# Patient Record
Sex: Female | Born: 1965
Health system: Southern US, Community
[De-identification: ages and names within clinical notes are randomized; demographics above are authoritative.]

## PROBLEM LIST (undated history)

## (undated) DIAGNOSIS — D219 Benign neoplasm of connective and other soft tissue, unspecified: Secondary | ICD-10-CM

## (undated) HISTORY — PX: TONSILLECTOMY: SUR1361

## (undated) HISTORY — PX: MYOMECTOMY ABDOMINAL APPROACH: SUR870

---

## 1998-12-25 ENCOUNTER — Inpatient Hospital Stay (HOSPITAL_COMMUNITY): Admission: AD | Admit: 1998-12-25 | Discharge: 1998-12-25 | Payer: Self-pay | Admitting: Obstetrics

## 1999-05-18 ENCOUNTER — Emergency Department (HOSPITAL_COMMUNITY): Admission: EM | Admit: 1999-05-18 | Discharge: 1999-05-18 | Payer: Self-pay | Admitting: Emergency Medicine

## 1999-06-24 ENCOUNTER — Emergency Department (HOSPITAL_COMMUNITY): Admission: EM | Admit: 1999-06-24 | Discharge: 1999-06-24 | Payer: Self-pay

## 1999-09-12 ENCOUNTER — Inpatient Hospital Stay (HOSPITAL_COMMUNITY): Admission: AD | Admit: 1999-09-12 | Discharge: 1999-09-12 | Payer: Self-pay | Admitting: Obstetrics

## 1999-12-03 ENCOUNTER — Inpatient Hospital Stay (HOSPITAL_COMMUNITY): Admission: AD | Admit: 1999-12-03 | Discharge: 1999-12-03 | Payer: Self-pay | Admitting: *Deleted

## 2000-08-11 ENCOUNTER — Inpatient Hospital Stay (HOSPITAL_COMMUNITY): Admission: AD | Admit: 2000-08-11 | Discharge: 2000-08-11 | Payer: Self-pay | Admitting: Obstetrics & Gynecology

## 2001-05-22 ENCOUNTER — Encounter: Admission: RE | Admit: 2001-05-22 | Discharge: 2001-05-22 | Payer: Self-pay | Admitting: Obstetrics & Gynecology

## 2001-05-29 ENCOUNTER — Ambulatory Visit (HOSPITAL_COMMUNITY): Admission: RE | Admit: 2001-05-29 | Discharge: 2001-05-29 | Payer: Self-pay | Admitting: Obstetrics

## 2001-06-12 ENCOUNTER — Emergency Department (HOSPITAL_COMMUNITY): Admission: EM | Admit: 2001-06-12 | Discharge: 2001-06-12 | Payer: Self-pay | Admitting: Emergency Medicine

## 2001-06-12 ENCOUNTER — Encounter: Payer: Self-pay | Admitting: Emergency Medicine

## 2002-07-10 ENCOUNTER — Ambulatory Visit (HOSPITAL_COMMUNITY): Admission: RE | Admit: 2002-07-10 | Discharge: 2002-07-10 | Payer: Self-pay | Admitting: *Deleted

## 2003-05-06 ENCOUNTER — Encounter: Admission: RE | Admit: 2003-05-06 | Discharge: 2003-05-06 | Payer: Self-pay | Admitting: Obstetrics and Gynecology

## 2003-05-12 ENCOUNTER — Ambulatory Visit (HOSPITAL_COMMUNITY): Admission: RE | Admit: 2003-05-12 | Discharge: 2003-05-12 | Payer: Self-pay | Admitting: Obstetrics and Gynecology

## 2003-06-10 ENCOUNTER — Encounter: Admission: RE | Admit: 2003-06-10 | Discharge: 2003-06-10 | Payer: Self-pay | Admitting: Obstetrics and Gynecology

## 2003-06-30 ENCOUNTER — Emergency Department (HOSPITAL_COMMUNITY): Admission: EM | Admit: 2003-06-30 | Discharge: 2003-06-30 | Payer: Self-pay | Admitting: Emergency Medicine

## 2003-07-15 ENCOUNTER — Ambulatory Visit (HOSPITAL_COMMUNITY): Admission: RE | Admit: 2003-07-15 | Discharge: 2003-07-15 | Payer: Self-pay | Admitting: Obstetrics and Gynecology

## 2003-12-16 ENCOUNTER — Encounter: Admission: RE | Admit: 2003-12-16 | Discharge: 2003-12-16 | Payer: Self-pay | Admitting: Obstetrics and Gynecology

## 2004-05-07 ENCOUNTER — Other Ambulatory Visit: Admission: RE | Admit: 2004-05-07 | Discharge: 2004-05-07 | Payer: Self-pay | Admitting: Obstetrics and Gynecology

## 2004-05-13 ENCOUNTER — Encounter: Admission: RE | Admit: 2004-05-13 | Discharge: 2004-05-13 | Payer: Self-pay | Admitting: Obstetrics and Gynecology

## 2004-06-04 ENCOUNTER — Ambulatory Visit (HOSPITAL_COMMUNITY): Admission: RE | Admit: 2004-06-04 | Discharge: 2004-06-04 | Payer: Self-pay | Admitting: Obstetrics and Gynecology

## 2004-07-07 ENCOUNTER — Encounter (INDEPENDENT_AMBULATORY_CARE_PROVIDER_SITE_OTHER): Payer: Self-pay | Admitting: *Deleted

## 2004-07-07 ENCOUNTER — Inpatient Hospital Stay (HOSPITAL_COMMUNITY): Admission: RE | Admit: 2004-07-07 | Discharge: 2004-07-09 | Payer: Self-pay | Admitting: Obstetrics and Gynecology

## 2004-08-03 ENCOUNTER — Ambulatory Visit (HOSPITAL_COMMUNITY): Admission: RE | Admit: 2004-08-03 | Discharge: 2004-08-03 | Payer: Self-pay | Admitting: Obstetrics and Gynecology

## 2005-03-16 ENCOUNTER — Encounter: Admission: RE | Admit: 2005-03-16 | Discharge: 2005-03-16 | Payer: Self-pay | Admitting: Obstetrics and Gynecology

## 2005-05-10 ENCOUNTER — Other Ambulatory Visit: Admission: RE | Admit: 2005-05-10 | Discharge: 2005-05-10 | Payer: Self-pay | Admitting: Obstetrics and Gynecology

## 2005-08-05 ENCOUNTER — Encounter: Admission: RE | Admit: 2005-08-05 | Discharge: 2005-08-05 | Payer: Self-pay | Admitting: Obstetrics and Gynecology

## 2005-12-14 ENCOUNTER — Emergency Department (HOSPITAL_COMMUNITY): Admission: EM | Admit: 2005-12-14 | Discharge: 2005-12-14 | Payer: Self-pay | Admitting: Emergency Medicine

## 2005-12-15 ENCOUNTER — Emergency Department (HOSPITAL_COMMUNITY): Admission: EM | Admit: 2005-12-15 | Discharge: 2005-12-15 | Payer: Self-pay | Admitting: Emergency Medicine

## 2006-03-17 ENCOUNTER — Encounter (INDEPENDENT_AMBULATORY_CARE_PROVIDER_SITE_OTHER): Payer: Self-pay | Admitting: Specialist

## 2006-03-17 ENCOUNTER — Ambulatory Visit (HOSPITAL_COMMUNITY): Admission: RE | Admit: 2006-03-17 | Discharge: 2006-03-17 | Payer: Self-pay | Admitting: Obstetrics and Gynecology

## 2006-05-26 ENCOUNTER — Other Ambulatory Visit: Admission: RE | Admit: 2006-05-26 | Discharge: 2006-05-26 | Payer: Self-pay | Admitting: Obstetrics and Gynecology

## 2006-06-02 ENCOUNTER — Encounter: Admission: RE | Admit: 2006-06-02 | Discharge: 2006-06-02 | Payer: Self-pay | Admitting: Obstetrics and Gynecology

## 2006-08-07 ENCOUNTER — Encounter: Admission: RE | Admit: 2006-08-07 | Discharge: 2006-08-07 | Payer: Self-pay | Admitting: Obstetrics and Gynecology

## 2007-04-17 ENCOUNTER — Emergency Department (HOSPITAL_COMMUNITY): Admission: EM | Admit: 2007-04-17 | Discharge: 2007-04-17 | Payer: Self-pay | Admitting: Emergency Medicine

## 2007-06-22 ENCOUNTER — Other Ambulatory Visit: Admission: RE | Admit: 2007-06-22 | Discharge: 2007-06-22 | Payer: Self-pay | Admitting: Obstetrics and Gynecology

## 2007-08-17 ENCOUNTER — Encounter: Admission: RE | Admit: 2007-08-17 | Discharge: 2007-08-17 | Payer: Self-pay | Admitting: Obstetrics and Gynecology

## 2008-05-17 ENCOUNTER — Inpatient Hospital Stay (HOSPITAL_COMMUNITY): Admission: AD | Admit: 2008-05-17 | Discharge: 2008-05-17 | Payer: Self-pay | Admitting: Obstetrics & Gynecology

## 2008-08-21 ENCOUNTER — Encounter: Admission: RE | Admit: 2008-08-21 | Discharge: 2008-08-21 | Payer: Self-pay | Admitting: Obstetrics and Gynecology

## 2008-11-30 ENCOUNTER — Emergency Department (HOSPITAL_COMMUNITY): Admission: EM | Admit: 2008-11-30 | Discharge: 2008-11-30 | Payer: Self-pay | Admitting: Emergency Medicine

## 2009-02-12 ENCOUNTER — Ambulatory Visit (HOSPITAL_COMMUNITY): Admission: RE | Admit: 2009-02-12 | Discharge: 2009-02-12 | Payer: Self-pay | Admitting: Gastroenterology

## 2009-02-12 ENCOUNTER — Encounter (INDEPENDENT_AMBULATORY_CARE_PROVIDER_SITE_OTHER): Payer: Self-pay | Admitting: *Deleted

## 2009-03-09 ENCOUNTER — Inpatient Hospital Stay (HOSPITAL_COMMUNITY): Admission: AD | Admit: 2009-03-09 | Discharge: 2009-03-09 | Payer: Self-pay | Admitting: Obstetrics & Gynecology

## 2009-03-15 ENCOUNTER — Emergency Department (HOSPITAL_COMMUNITY): Admission: EM | Admit: 2009-03-15 | Discharge: 2009-03-15 | Payer: Self-pay | Admitting: Emergency Medicine

## 2009-03-21 ENCOUNTER — Emergency Department (HOSPITAL_COMMUNITY): Admission: EM | Admit: 2009-03-21 | Discharge: 2009-03-21 | Payer: Self-pay | Admitting: Emergency Medicine

## 2009-04-01 ENCOUNTER — Encounter: Admission: RE | Admit: 2009-04-01 | Discharge: 2009-04-01 | Payer: Self-pay | Admitting: Family Medicine

## 2009-04-20 ENCOUNTER — Encounter: Admission: RE | Admit: 2009-04-20 | Discharge: 2009-04-20 | Payer: Self-pay | Admitting: Otolaryngology

## 2009-05-22 ENCOUNTER — Encounter (INDEPENDENT_AMBULATORY_CARE_PROVIDER_SITE_OTHER): Payer: Self-pay | Admitting: *Deleted

## 2009-06-17 ENCOUNTER — Telehealth: Payer: Self-pay | Admitting: Gastroenterology

## 2009-08-24 ENCOUNTER — Encounter: Admission: RE | Admit: 2009-08-24 | Discharge: 2009-08-24 | Payer: Self-pay | Admitting: Obstetrics and Gynecology

## 2009-09-01 ENCOUNTER — Encounter: Admission: RE | Admit: 2009-09-01 | Discharge: 2009-09-01 | Payer: Self-pay | Admitting: Obstetrics and Gynecology

## 2010-07-31 ENCOUNTER — Other Ambulatory Visit: Payer: Self-pay | Admitting: Obstetrics and Gynecology

## 2010-07-31 DIAGNOSIS — Z1239 Encounter for other screening for malignant neoplasm of breast: Secondary | ICD-10-CM

## 2010-08-01 ENCOUNTER — Encounter: Payer: Self-pay | Admitting: Obstetrics and Gynecology

## 2010-08-30 ENCOUNTER — Ambulatory Visit
Admission: RE | Admit: 2010-08-30 | Discharge: 2010-08-30 | Disposition: A | Discharge status: Home / Self Care | Payer: PRIVATE HEALTH INSURANCE | Source: Ambulatory Visit | Attending: Obstetrics and Gynecology | Admitting: Obstetrics and Gynecology

## 2010-08-30 DIAGNOSIS — Z1239 Encounter for other screening for malignant neoplasm of breast: Secondary | ICD-10-CM

## 2010-11-23 NOTE — Op Note (Signed)
[name], [name]                ACCOUNT NO.:  [id number]   MEDICAL RECORD NO.:  [id number]          PATIENT TYPE:  AMB   LOCATION:  ENDO                         FACILITY:  [facility]   PHYSICIAN:  Shirley Friar, MDDATE OF BIRTH:  [birth year]   DATE OF PROCEDURE:  DATE OF DISCHARGE:                               OPERATIVE REPORT   PROCEDURE:  Esophagogastroduodenoscopy with Bravo capsule placement.   INDICATIONS:  Heartburn, esophageal reflux.   MEDICATIONS:  Fentanyl 100 mcg IV, Versed 10 mg IV, Cetacaine spray x2.   FINDINGS:  The endoscope was inserted through the oropharynx and  esophagus was intubated, which was normal in its entirety.  Endoscope  was advanced down to the stomach, which revealed normal-appearing  gastric mucosa.  Retroflexion was done, which revealed normal proximal  stomach.  Endoscope was straightened and advanced to the duodenal bulb  and second portion of duodenum, which were both normal.  Endoscope was  withdrawn back into the stomach and up to the gastroesophageal junction.  The Z-line was noted to be at 38 cm from the incisors.  This measurement  was used to decide on the location for placement of the Bravo capsule,  which would be 6 cm above the Z-line.  The endoscope was then withdrawn.  The Bravo capsule catheter with the capsule attached was inserted in the  usual fashion and passed down to 32 cm from the incisors.  The Bravo  capsule was deployed in the usual fashion and the catheter was removed.  The endoscope was re-inserted and confirmed successful placement of the  Bravo capsule and the capsule is functioning appropriately.   ASSESSMENT:  1. Normal upper endoscopy.  2. Status post Bravo capsule placement to do 48-hour pH study.   PLAN:  Follow up on Bravo capsule results with the patient doing the  Bravo capsule off of all proton pump inhibitor therapies and avoiding  other antireflux therapies during the 48-hour test.      Shirley Friar, MD  Electronically Signed     VCS/MEDQ  D:  02/12/2009  T:  02/12/2009  Job:  [id number]

## 2010-11-26 NOTE — H&P (Signed)
[name], [name]                ACCOUNT NO.:  [id number]   MEDICAL RECORD NO.:  [id number]          PATIENT TYPE:  AMB   LOCATION:  SDC                           [facility]   PHYSICIAN:  James A. Ashley Royalty, M.D.DATE OF BIRTH:  03-Mar-1966   DATE OF ADMISSION:  03/17/2006  DATE OF DISCHARGE:                                HISTORY & PHYSICAL   A 45 year old nulligravida status post myomectomy presented February 10, 2006  complaining of abnormal uterine bleeding. She stated she had a period January 09, 2006 and then had another period February 03, 2006. Subsequent  sonohysterogram revealed a fundal submucosal myoma of approximately 1.5 cm  in greatest diameter. The patient presents for diagnostic/operative  hysteroscopy.   MEDICATIONS:  Oral contraceptives.   PAST MEDICAL HISTORY:  1. Medical negative.  2. Surgical - tonsillectomy, myomectomy.   ALLERGIES:  PENICILLIN.   FAMILY HISTORY:  Noncontributory.   SOCIAL HISTORY:  The patient denies the use of tobacco or significant  alcohol.   REVIEW OF SYSTEMS:  Noncontributory.   PHYSICAL EXAMINATION:  GENERAL:  Well-developed, well-nourished, pleasant  female in no acute distress.  VITAL SIGNS:  Afebrile, vital signs stable.  CHEST:  Lungs are clear.  CARDIAC:  Regular rate and rhythm.  ABDOMEN:  Soft, nontender.  MUSCULOSKELETAL:  No edema.  PELVIC:  Please see August 14 office note. External genitalia within normal  limits. Vagina and cervix without gross lesions. Bimanual examination  reveals the uterus to be 9 x 6 x 5 cm with slight nodularity, no adnexal  masses.   IMPRESSION:  1. Fibroid uterus with 1.5 cm bundle submucosal myoma.  2. Abnormal uterine bleeding - possibly secondary to #1.  3. Status post myomectomy.   PLAN:  Diagnostic/operative hysteroscopy with myomectomy and dilatation and  curettage. The risks, benefits, complications and alternatives were fully  discussed with the patient. She states she understands  and accepts.  Questions invited and answered. Please note a portion of the evaluation for  this documented was performed in the outpatient setting.      James A. Ashley Royalty, M.D.  Electronically Signed     JAM/MEDQ  D:  03/17/2006  T:  03/17/2006  Job:  [id number]

## 2010-11-26 NOTE — H&P (Signed)
[name], [name]                ACCOUNT NO.:  [id number]   MEDICAL RECORD NO.:  [id number]          PATIENT TYPE:  INP   LOCATION:  NA                            [facility]   PHYSICIAN:  James A. Ashley Royalty, M.D.DATE OF BIRTH:  10-Mar-1966   DATE OF ADMISSION:  07/07/2004  DATE OF DISCHARGE:                                HISTORY & PHYSICAL   HISTORY:  Patient is a 45 year old nulligravida who presented to me May 07, 2004 desiring a second opinion regarding a fibroid uterus.  She stated  her periods were approximately every month on oral contraceptives lasting 6-  7 days and quite heavy.  She stated a desire for myomectomy.  She does  ultimately want to have children if possible.   At the time of the initial visit she brought with her a pelvic ultrasound  performed at [facility] [place] dated April 14, 2003.  At that  time the study revealed a large fundal fibroid measuring 14.8 cm in greatest  diameter.  Two subserosal fibroids were identified.  One was in the  posterior uterine segment and measured 7.4 cm in greatest diameter.  Another  one was noted in the anterior fundal region and measured 2.4 cm in greatest  diameter.   Subsequent ultrasound was performed June 04, 2004 at [facility]  [place].  At this time the uterus was noted to be markedly enlarged with  a large subserosal fibroid extending from the uterine fundus measuring  approximately 15.5 cm in greatest diameter.  At least two other fibroids  were noted.  One was in the posterior lower uterine segment measuring 6.5 cm  in greatest diameter and a third in the right lateral uterine body measuring  3.7 cm in greatest diameter.  The endometrium was not well visualized.  The  ovaries appeared normal bilaterally.   MEDICATIONS:  Tri-Sprintec.   PAST MEDICAL HISTORY:  1.  Medical:  Negative.  2.  Surgical:  Tonsillectomy.   ALLERGIES:  PENICILLIN - hives.   FAMILY HISTORY:  Positive  for hypertension and diabetes.   SOCIAL HISTORY:  Patient denies use of tobacco or significant alcohol.   REVIEW OF SYSTEMS:  Noncontributory.   PHYSICAL EXAMINATION:  GENERAL:  Well-developed, well-nourished pleasant  female in no acute distress.  Afebrile.  Vital signs stable.  Skin warm and  dry without lesions.  LYMPHATICS:  There is no supraclavicular, cervical, or inguinal adenopathy.  HEENT:  Normocephalic.  Neck supple without thyromegaly.  CHEST:  Lungs are clear.  CARDIAC:  Regular rate and rhythm without murmurs, gallops, or rubs.  BREAST EXAM:  Deferred.  Please see office note dated May 07, 2004.  ABDOMEN:  Soft and nontender without masses or organomegaly.  Bowel sounds  are active.  MUSCULOSKELETAL:  No CVA tenderness.  PELVIC EXAMINATION:  External genitalia within normal limits.  Vagina and  cervix without gross lesions.  The cervix is deviated anteriorly.  Bimanual  examination reveals a huge pelvoabdominal mass to approximately two  fingerbreadths beneath the umbilicus.  Rectovaginal examination was  confirmed.   IMPRESSION:  1.  Fibroid uterus with at least three sizeable fibroids present.  2.  Desire for maintenance for childbearing capability.  3.  Right breast mass under evaluation at [facility].   PLAN:  Myomectomy.  Risks, benefits, complications and alternatives fully  discussed with the patient.  She states she understands and accepts.  I  discussed with her the possibility of violation of the uterine cavity and  the necessity for subsequent delivery via cesarean section and the patient  stated she understands and accepts.  Questions were invited and answered.      JAM/MEDQ  D:  07/06/2004  T:  07/06/2004  Job:  [id number]

## 2010-11-26 NOTE — Op Note (Signed)
[name], [name]                ACCOUNT NO.:  [id number]   MEDICAL RECORD NO.:  [id number]          PATIENT TYPE:  INP   LOCATION:  9304                          [facility]ACILITY[facility]  [facility]   PHYSICIAN:  James A. Ashley Royalty, M.D.DATE OF BIRTH:  07-08-1966   DATE OF PROCEDURE:  07/07/2004  DATE OF DISCHARGE:                                 OPERATIVE REPORT   PREOPERATIVE DIAGNOSIS:  Fibroid uterus.   POSTOPERATIVE DIAGNOSES:  1.  Fibroid uterus, pathology pending.  2.  A 2 cm apparent physiologic cyst of the left ovary.   PROCEDURE:  Multiple myomectomy.   SURGEON:  Rudy Jew. Ashley Royalty, M.D.   ESTIMATED BLOOD LOSS:  125 mL.   COMPLICATIONS:  None.   PACKS AND DRAINS:  Foley.   Sponge, needle, and instrument count reported as correct x2.   PROCEDURE:  The patient was taken to the operating room and placed in the  dorsal supine position.  After a general anesthetic was administered, she  was placed in the frogleg position and prepped for abdominal surgery.  A  Foley catheter was placed and she was returned to the supine position.  She  was draped for surgery.  A longitudinal incision was then made from the  umbilicus to the symphysis pubis.  The underlying subcutaneous tissues were  sharply and bluntly dissected down to the fascia, which was nicked with a  knife and incised longitudinally with Metzenbaum scissors.  The rectus  muscles were separated in the midline exposing the peritoneum, which was  elevated with hemostats and entered atraumatically with Metzenbaum scissors.  The incision was extended longitudinally.  At this point the pelvis was  inspected and a huge fibroid uterus was encountered.  There were three  sizeable fibroids but one much larger than the remainder and measuring  perhaps 16 cm in greatest diameter or greater.  Another one of 3-4 cm size  was located on the anterior aspect of the fundus, and a third sizeable  fibroid was located on the posterior aspect of the  fundus on the right side.  The round ligaments and fallopian tubes were visualized bilaterally and the  corpus was identified in the face of the huge volume of fibroids.  The right  ovary appeared normal size, shape, and contour.  The left ovary contained  approximately a 2 cm apparent physiologic cyst.  The remainder of the  peritoneal surfaces were smooth and glistening.  I could appreciate no other  pathology.   O'Connor-O'Sullivan retractor was placed into the abdominal cavity and the  uterine fibroids exteriorized.  The upper abdomen was packed off with  laparotomy sponges, which were wrapped.  Next, Pitressin was instilled into  the fibroids destined for removal.  The concentration was 20 units/100 mL.  A longitudinal incision was made on the uterus anteriorly in order to  accommodate removal insofar as possible of the fibroids.  The fibroids were  dissected free using sharp and blunt dissection and submitted to pathology  for histologic studies.  Careful attention was paid to avoid entrance into  the uterine cavity and damage to the  fallopian tubes.   Next a posterior incision was made on the uterus in order to accommodate the  removal of the large posterior fibroid and other small fibroids as well.  The fibroids were successfully removed using sharp and blunt dissection and  submitted to pathology for histologic studies.   At this point, several figure-of-eight sutures of 0 Vicryl were required to  obtain hemostasis of the surgical bed.  Hemostasis was noted.  The dead  space was then closed with 0 Vicryl in interrupted fashion.  On the serosa,  a 3-0 Monocryl baseball stitch was employed.  At the conclusion of the  myomectomy, the integrity of the uterus appeared completely intact and  hemostatic.  The procedure was performed without apparent trauma to either  fallopian tube or entrance into the uterine cavity.  The uterus and adnexa  were returned into the pelvis and  hemostasis noted.  After hemostasis was  obtained, the anterior and posterior surfaces of the uterus were covered  with Interceed.  All laparotomy sponges were removed.  The peritoneum and  fascia were then closed with a modified Smead-Jones suture of #1 PDS.  Copious irrigation was accomplished.  Hemostasis was noted.  The skin was  closed with staples.  The patient tolerated the procedure extremely well and  was returned to the recovery room in good condition.  At the conclusion of  the procedure, the urine was clear and copious.      JAM/MEDQ  D:  07/07/2004  T:  07/07/2004  Job:  [id number]

## 2010-11-26 NOTE — Group Therapy Note (Signed)
NAME:  [name], [name]                          ACCOUNT NO.:  [id number]   MEDICAL RECORD NO.:  [id number]                   PATIENT TYPE:  OUT   LOCATION:  [facility]                           FACILITY:  [facility]   PHYSICIAN:  Argentina Donovan, MD                     DATE OF BIRTH:  1965/10/04   DATE OF SERVICE:  12/16/2003                                    CLINIC NOTE   REASON FOR VISIT:  The patient is a 45 year old nulligravida black female  who weighs 133 pounds and has uterine fibroids that go up to the umbilicus.  They have not grown since October and she wants them treated.  We have  discussed the alternative treatments of hysterectomy, shrinking them down  with Lupron Depot and then doing a hysterectomy at a later date, the  possibility of having uterine artery embolization done, and we have  discussed all the problems associated with these.  The patient is going to  consider those and let me know what she wants done over the next few months.   DIAGNOSIS:  Symptomatic leiomyomata uteri.                                               Argentina Donovan, MD    PR/MEDQ  D:  12/16/2003  T:  12/16/2003  Job:  [id number]

## 2010-11-26 NOTE — Discharge Summary (Signed)
[name], [name]                ACCOUNT NO.:  [id number]   MEDICAL RECORD NO.:  [id number]          PATIENT TYPE:  INP   LOCATION:  9304                          [facility]   PHYSICIAN:  James A. Ashley Royalty, M.D.DATE OF BIRTH:  08-26-1965   DATE OF ADMISSION:  07/07/2004  DATE OF DISCHARGE:  07/09/2004                                 DISCHARGE SUMMARY   DISCHARGE DIAGNOSES:  1.  Fibroid uterus with at least 3 sizeable fibroids present.  2.  Desire for maintenance of childbearing capability.  3.  Right breast mass under evaluation at [facility].  4.  Status post myomectomy.   OPERATIONS AND SPECIAL PROCEDURES:  Multiple myomectomy.   CONSULTATIONS:  None.   DISCHARGE MEDICATIONS:  Percocet.   HISTORY AND PHYSICAL:  A 45 year old nulligravida, presented to me May 07, 2004, desiring a second opinion regarding a fibroid uterus.  She stated  her periods were approximately every month, lasting 6-7 days, quite heavy.  She stated the desire for myomectomy.  She wanted to preserve her  childbearing capability.  Subsequently ultrasound revealed a large fundal  fibroid, measuring 14.8 cm in greatest diameter.  Two other subserosal  fibroids were identified as well.  For the remainder of the history and  physical, please see chart.   HOSPITAL COURSE:  The patient was admitted to [facility]  [place].  Initial laboratory studies were drawn.  On July 07, 2004,  she was taken to the operating room and underwent multiple myomectomy.  The  procedure was accomplished by Dr. Sylvester Harder.  Dr. Mia Creek was  scheduled to assist, but he was unable to attend.  The procedure was  uncomplicated.  The patient's postoperative course was completely benign,  and she was discharged July 09, 2004, to her home, afebrile and in  satisfactory condition.   DISPOSITION:  The patient is to return to [facility] in 4-6 weeks for  postpartum evaluation.      JAM/MEDQ  D:  07/28/2004  T:  07/28/2004  Job:  [id number]

## 2010-11-26 NOTE — Group Therapy Note (Signed)
   NAME:  [name], [name]                          ACCOUNT NO.:  [id number]   MEDICAL RECORD NO.:  [id number]                   PATIENT TYPE:  OUT   LOCATION:  [facility]                           [facility]   PHYSICIAN:  Argentina Donovan, MD                     DATE OF BIRTH:  [birth year]   DATE OF SERVICE:                                    CLINIC NOTE   The patient is a 45 year old nulligravida, black female who has been on  Ortho Tri-Cyclen for many years, was seen in [facility] but has had  abnormal bleeding, heavy and long periods for the past several months.  She  was last seen at [facility] in 2002 and was told that  she had a uterus that measured 12- to -14-weeks size.  The patient comes in  today on abdominal examination, the uterus goes up to the umbilicus and  mimics the uterus of about 20- to -22-weeks size.  The patient is allergic  to penicillin however, has no other health complaints.   Pap smear was taken.   PHYSICAL EXAMINATION:  PELVIC:  External genitalia was normal.  BUS within  normal limits.  Vagina is clean and well rugated.  The cervix is nulliparous  and very, very anterior, difficult to visualize and the uterus is as  aforementioned with adnexa that could not be palpated because of the size of  the uterus in this very slender 127 pound lady of 5 foot 1.  VITAL SIGNS:  Blood pressure of 123/84, pulse 74, and a temperature of 98.1.   PLAN:  The plan was to get a sonogram to evaluate the endometrial cavity for  possible endometrial biopsy or D&C, although a biopsy would be difficult  because of the position of the cervix, to measure the size of the uterus for  comparison if Depo, Lupron is given in the future and to put the patient on  Jasmin for several months in order to see if the bleeding is due to the  fibroids or the fact that she is on a very low-dose oral contraceptive.  We  will also give her a prescription for iron and get a  CBC.   IMPRESSION:  Symptomatic leiomyomata uteri.   We discussed briefly hysterectomy versus myomectomy with this patient, and  we will discuss it further after we get the sonogram.                                               Argentina Donovan, MD    PR/MEDQ  D:  05/06/2003  T:  05/06/2003  Job:  [id number]

## 2010-11-26 NOTE — Group Therapy Note (Signed)
NAME:  [name], [name]                          ACCOUNT NO.:  [id number]   MEDICAL RECORD NO.:  [id number]                   PATIENT TYPE:  OUT   LOCATION:  [facility]                           [facility]   PHYSICIAN:  Argentina Donovan, MD                     DATE OF BIRTH:  September 07, 1965   DATE OF SERVICE:  06/10/2003                                    CLINIC NOTE   The patient is a 45 year old nulligravida black female who has been on Ortho  Tri-Cyclen for many years, came in with large leiomyomata uteri.  On the  last ultrasound showed significant growth of the fundal fibroid from around  9 cm in diameter to 14 in diameter over a period of  two years.  She came in  to discuss myomectomy.  She also is interested in considering uterine artery  embolization, and we would discuss the possibility of doing that.  She is  going to think this over and give me a call, and if she decides on the  uterine embolization as a possibility will refer her to an interventional  radiologist to discuss the problem with her.   IMPRESSION:  Symptomatic leiomyomata uteri.                                               Argentina Donovan, MD    PR/MEDQ  D:  06/10/2003  T:  06/10/2003  Job:  [id number]

## 2010-11-26 NOTE — Op Note (Signed)
[name], [name]                ACCOUNT NO.:  [id number]   MEDICAL RECORD NO.:  [id number]          PATIENT TYPE:  AMB   LOCATION:  SDC                           [facility]   PHYSICIAN:  James A. Ashley Royalty, M.D.DATE OF BIRTH:  05-10-1966   DATE OF PROCEDURE:  03/17/2006  DATE OF DISCHARGE:                                 OPERATIVE REPORT   PREOPERATIVE DIAGNOSES:  1. Fibroid uterus.  2. Abnormal uterine bleeding.   POSTOPERATIVE DIAGNOSES:  1. Fibroid uterus.  2. Abnormal uterine bleeding, pathology pending.   PROCEDURE:  1. Diagnostic hysteroscopy.  2. Dilatation and curettage.   SURGEON:  Rudy Jew. Ashley Royalty, M.D.   ANESTHESIA:  General, 1% Xylocaine paracervical block (20 mL).   ESTIMATED BLOOD LOSS:  Was 25 mL.   COMPLICATIONS:  None.   PACKS AND DRAINS:   DESCRIPTION OF PROCEDURE:  The patient taken to the operating room and  placed in the dorsal supine position.  After general anesthetic was  administered she was placed in the lithotomy position and prepped and draped  in the usual the manner for vaginal surgery.  A posterior weighted retractor  was placed per vagina.  The anterior lip of the cervix was grasped with a  single-tooth tenaculum.  The uterus was gently sounded to approximately 9  cm.  It was noted to be retroverted.  The cervix was then dilated to a size  29-French using Shawnie Pons dilators.  The resectoscope was placed into the  uterine cavity.  Sorbitol was used as a distension medium.  The patient had  been known to have a fundal fibroid noted at sonohysterogram.  A careful  examination of the uterine cavity revealed no substantial encroachment of  this fibroid upon the endometrial cavity.  Considering  the fundal location  it did not seem prudent to resect the area when there was no obvious  convexity suggesting a submucosal encroachment and a fibroid at hysteroscopy  as was implied at sono-histogram.  The tubal ostia were seen bilaterally.  Appropriate photos were obtained.  The endocervical canal appeared normal.  At this point the patient was felt to have benefited maximally from the  hysteroscopy portion of procedure.   The hysteroscope was removed and attention turned to the curettage.  A  medium-sized curette was introduced into the uterine cavity.  First a four-  quadrant curettage was performed.  Then a therapeutic curettage was  performed.  All curettings were submitted to pathology for histologic  studies.   At this point the patient is felt to have benefited maximally from the  surgical procedure.  The vaginal instruments were removed.  Hemostasis noted  and the procedure terminated.  The patient tolerated the procedure extremely  well and was returned to the recovery room in good condition.      James A. Ashley Royalty, M.D.  Electronically Signed     JAM/MEDQ  D:  03/17/2006  T:  03/17/2006  Job:  [id number]

## 2011-04-13 LAB — WET PREP, GENITAL
Clue Cells Wet Prep HPF POC: NONE SEEN
Trich, Wet Prep: NONE SEEN
Yeast Wet Prep HPF POC: NONE SEEN

## 2011-04-13 LAB — URINALYSIS, ROUTINE W REFLEX MICROSCOPIC
Bilirubin Urine: NEGATIVE
Glucose, UA: NEGATIVE
Hgb urine dipstick: NEGATIVE
Ketones, ur: NEGATIVE
Nitrite: NEGATIVE
Protein, ur: NEGATIVE
Specific Gravity, Urine: >1.03 — ABNORMAL HIGH
Urobilinogen, UA: 0.2
pH: 5.5

## 2011-04-13 LAB — POCT PREGNANCY, URINE: Preg Test, Ur: NEGATIVE

## 2011-04-21 LAB — POCT CARDIAC MARKERS
CKMB, poc: 2.2
Myoglobin, poc: 64.3
Operator id: 4761

## 2011-04-21 LAB — DIFFERENTIAL
Basophils Absolute: 0.1
Basophils Relative: 1
Eosinophils Absolute: 0.1
Eosinophils Relative: 2
Lymphocytes Relative: 31
Lymphs Abs: 2.7
Monocytes Absolute: 0.6
Monocytes Relative: 7
Neutro Abs: 5.1
Neutrophils Relative %: 59

## 2011-04-21 LAB — CBC
Platelets: 292
RBC: 4.46
WBC: 8.6

## 2011-05-24 ENCOUNTER — Encounter (HOSPITAL_COMMUNITY): Payer: Self-pay | Admitting: *Deleted

## 2011-05-24 ENCOUNTER — Inpatient Hospital Stay (HOSPITAL_COMMUNITY)
Admission: AD | Admit: 2011-05-24 | Discharge: 2011-05-24 | Disposition: A | Discharge status: Home / Self Care | Payer: PRIVATE HEALTH INSURANCE | Source: Ambulatory Visit | Attending: Family Medicine | Admitting: Family Medicine

## 2011-05-24 DIAGNOSIS — N949 Unspecified condition associated with female genital organs and menstrual cycle: Secondary | ICD-10-CM | POA: Insufficient documentation

## 2011-05-24 DIAGNOSIS — B3731 Acute candidiasis of vulva and vagina: Secondary | ICD-10-CM | POA: Insufficient documentation

## 2011-05-24 DIAGNOSIS — B373 Candidiasis of vulva and vagina: Secondary | ICD-10-CM | POA: Insufficient documentation

## 2011-05-24 HISTORY — DX: Benign neoplasm of connective and other soft tissue, unspecified: D21.9

## 2011-05-24 LAB — WET PREP, GENITAL: Trich, Wet Prep: NONE SEEN

## 2011-05-24 MED ORDER — FLUCONAZOLE 150 MG PO TABS: 150.0000 mg | ORAL_TABLET | Freq: Once | ORAL | Status: AC

## 2011-05-24 NOTE — ED Provider Notes (Signed)
Chart reviewed and agree with management and plan.  

## 2011-05-24 NOTE — Progress Notes (Signed)
Patient states she is taking antibiotics for a URI and has started having vaginal itching, no discharge.

## 2011-05-24 NOTE — ED Provider Notes (Signed)
History   Pt presents today c/o vag irritation. She states she was taking an antibiotic for a URI and shortly after she began having vag irritation. She states she has used OTC monistat with some relief but continues to have irritation. She denies vag bleeding, fever, or any other sx at this time.  Chief Complaint  Patient presents with  . Vaginitis   HPI  OB History    Grav Para Term Preterm Abortions TAB SAB Ect Mult Living   0               Past Medical History  Diagnosis Date  . Fibroids     Past Surgical History  Procedure Date  . Myomectomy abdominal approach   . Tonsillectomy     Family History  Problem Relation Age of Onset  . Diabetes Mother   . Hypertension Mother   . Diabetes Father   . Hypertension Father     History  Substance Use Topics  . Smoking status: Former Games developer  . Smokeless tobacco: Not on file  . Alcohol Use: Yes    Allergies: No Known Allergies  Prescriptions prior to admission  Medication Sig Dispense Refill  . amoxicillin (AMOXIL) 875 MG tablet Take 875 mg by mouth 2 (two) times daily.        . norgestimate-ethinyl estradiol (MONONESSA) 0.25-35 MG-MCG tablet Take 1 tablet by mouth daily.          Review of Systems  Constitutional: Negative for fever.  Cardiovascular: Negative for chest pain.  Gastrointestinal: Negative for nausea, vomiting, abdominal pain, diarrhea and constipation.  Genitourinary: Negative for dysuria, urgency, frequency and hematuria.  Neurological: Negative for dizziness and headaches.  Psychiatric/Behavioral: Negative for depression and suicidal ideas.   Physical Exam   Blood pressure 121/82, pulse 77, temperature 98.4 F (36.9 C), temperature source Oral, resp. rate 16, height 5\' 2"  (1.575 m), weight 140 lb (63.504 kg), last menstrual period 04/26/2011, SpO2 99.00%.  Physical Exam  Nursing note and vitals reviewed. Constitutional: She is oriented to person, place, and time. She appears well-developed and  well-nourished. No distress.  HENT:  Head: Normocephalic and atraumatic.  Eyes: EOM are normal. Pupils are equal, round, and reactive to light.  GI: Soft. She exhibits no distension. There is no tenderness. There is no rebound and no guarding.  Genitourinary: No bleeding around the vagina. Vaginal discharge found.  Neurological: She is alert and oriented to person, place, and time.  Skin: Skin is warm and dry. She is not diaphoretic.  Psychiatric: She has a normal mood and affect. Her behavior is normal. Judgment and thought content normal.    MAU Course  Procedures  Wet prep done.  Results for orders placed during the hospital encounter of 05/24/11 (from the past 24 hour(s))  WET PREP, GENITAL     Status: Abnormal   Collection Time   05/24/11  9:51 AM      Component Value Range   Yeast, Wet Prep FEW (*) NONE SEEN    Trich, Wet Prep NONE SEEN  NONE SEEN    Clue Cells, Wet Prep NONE SEEN  NONE SEEN    WBC, Wet Prep HPF POC FEW (*) NONE SEEN     Assessment and Plan  Yeast vaginitis: discussed with pt at length. Will tx with diflucan. Discussed diet, activity, risks,and precautions.  Clinton Gallant. Kedrick Mcnamee III, DrHSc, MPAS, PA-C  05/24/2011, 10:01 AM   Henrietta Hoover, PA 05/24/11 [phone]

## 2011-07-25 ENCOUNTER — Other Ambulatory Visit: Payer: Self-pay | Admitting: Obstetrics and Gynecology

## 2011-07-25 DIAGNOSIS — Z1231 Encounter for screening mammogram for malignant neoplasm of breast: Secondary | ICD-10-CM

## 2011-09-05 ENCOUNTER — Ambulatory Visit
Admission: RE | Admit: 2011-09-05 | Discharge: 2011-09-05 | Disposition: A | Discharge status: Home / Self Care | Payer: PRIVATE HEALTH INSURANCE | Source: Ambulatory Visit | Attending: Obstetrics and Gynecology | Admitting: Obstetrics and Gynecology

## 2011-09-05 DIAGNOSIS — Z1231 Encounter for screening mammogram for malignant neoplasm of breast: Secondary | ICD-10-CM

## 2012-08-10 ENCOUNTER — Other Ambulatory Visit: Payer: Self-pay | Admitting: Obstetrics and Gynecology

## 2012-08-10 DIAGNOSIS — Z1231 Encounter for screening mammogram for malignant neoplasm of breast: Secondary | ICD-10-CM

## 2012-09-10 ENCOUNTER — Ambulatory Visit
Admission: RE | Admit: 2012-09-10 | Discharge: 2012-09-10 | Disposition: A | Discharge status: Home / Self Care | Payer: PRIVATE HEALTH INSURANCE | Source: Ambulatory Visit | Attending: Obstetrics and Gynecology | Admitting: Obstetrics and Gynecology

## 2012-09-10 ENCOUNTER — Ambulatory Visit: Payer: PRIVATE HEALTH INSURANCE

## 2012-09-10 DIAGNOSIS — Z1231 Encounter for screening mammogram for malignant neoplasm of breast: Secondary | ICD-10-CM

## 2013-07-31 ENCOUNTER — Other Ambulatory Visit: Payer: Self-pay

## 2013-07-31 DIAGNOSIS — Z1231 Encounter for screening mammogram for malignant neoplasm of breast: Secondary | ICD-10-CM

## 2013-09-16 ENCOUNTER — Ambulatory Visit
Admission: RE | Admit: 2013-09-16 | Discharge: 2013-09-16 | Disposition: A | Discharge status: Home / Self Care | Payer: PRIVATE HEALTH INSURANCE | Source: Ambulatory Visit

## 2013-09-16 DIAGNOSIS — Z1231 Encounter for screening mammogram for malignant neoplasm of breast: Secondary | ICD-10-CM

## 2013-09-18 ENCOUNTER — Other Ambulatory Visit: Payer: Self-pay | Admitting: Obstetrics and Gynecology

## 2014-07-21 ENCOUNTER — Other Ambulatory Visit: Payer: Self-pay

## 2014-07-21 DIAGNOSIS — Z1231 Encounter for screening mammogram for malignant neoplasm of breast: Secondary | ICD-10-CM

## 2014-09-22 ENCOUNTER — Ambulatory Visit: Payer: PRIVATE HEALTH INSURANCE

## 2014-09-24 ENCOUNTER — Ambulatory Visit
Admission: RE | Admit: 2014-09-24 | Discharge: 2014-09-24 | Disposition: A | Discharge status: Home / Self Care | Payer: PRIVATE HEALTH INSURANCE | Source: Ambulatory Visit

## 2014-09-24 ENCOUNTER — Encounter (INDEPENDENT_AMBULATORY_CARE_PROVIDER_SITE_OTHER): Payer: Self-pay

## 2014-09-24 ENCOUNTER — Other Ambulatory Visit: Payer: Self-pay | Admitting: Obstetrics and Gynecology

## 2014-09-24 DIAGNOSIS — Z1231 Encounter for screening mammogram for malignant neoplasm of breast: Secondary | ICD-10-CM

## 2014-09-25 LAB — CYTOLOGY - PAP

## 2015-07-21 ENCOUNTER — Other Ambulatory Visit: Payer: Self-pay | Admitting: Internal Medicine

## 2015-07-21 ENCOUNTER — Other Ambulatory Visit: Payer: Self-pay

## 2015-07-21 DIAGNOSIS — Z1231 Encounter for screening mammogram for malignant neoplasm of breast: Secondary | ICD-10-CM

## 2015-09-30 ENCOUNTER — Ambulatory Visit: Payer: PRIVATE HEALTH INSURANCE

## 2015-10-21 ENCOUNTER — Ambulatory Visit
Admission: RE | Admit: 2015-10-21 | Discharge: 2015-10-21 | Disposition: A | Discharge status: Home / Self Care | Payer: PRIVATE HEALTH INSURANCE | Source: Ambulatory Visit

## 2015-10-21 DIAGNOSIS — Z1231 Encounter for screening mammogram for malignant neoplasm of breast: Secondary | ICD-10-CM

## 2015-10-23 ENCOUNTER — Other Ambulatory Visit: Payer: Self-pay | Admitting: Obstetrics & Gynecology

## 2015-10-23 DIAGNOSIS — R928 Other abnormal and inconclusive findings on diagnostic imaging of breast: Secondary | ICD-10-CM

## 2015-10-30 ENCOUNTER — Ambulatory Visit
Admission: RE | Admit: 2015-10-30 | Discharge: 2015-10-30 | Disposition: A | Discharge status: Home / Self Care | Payer: PRIVATE HEALTH INSURANCE | Source: Ambulatory Visit | Attending: Obstetrics & Gynecology | Admitting: Obstetrics & Gynecology

## 2015-10-30 DIAGNOSIS — R928 Other abnormal and inconclusive findings on diagnostic imaging of breast: Secondary | ICD-10-CM

## 2016-07-20 ENCOUNTER — Other Ambulatory Visit: Payer: Self-pay | Admitting: Obstetrics & Gynecology

## 2016-07-20 DIAGNOSIS — Z1231 Encounter for screening mammogram for malignant neoplasm of breast: Secondary | ICD-10-CM

## 2016-10-24 ENCOUNTER — Ambulatory Visit: Payer: PRIVATE HEALTH INSURANCE

## 2016-10-24 ENCOUNTER — Ambulatory Visit
Admission: RE | Admit: 2016-10-24 | Discharge: 2016-10-24 | Disposition: A | Discharge status: Home / Self Care | Payer: PRIVATE HEALTH INSURANCE | Source: Ambulatory Visit | Attending: Obstetrics & Gynecology | Admitting: Obstetrics & Gynecology

## 2016-10-24 DIAGNOSIS — Z1231 Encounter for screening mammogram for malignant neoplasm of breast: Secondary | ICD-10-CM

## 2017-02-21 ENCOUNTER — Ambulatory Visit: Payer: PRIVATE HEALTH INSURANCE | Admitting: Family Medicine

## 2017-04-24 DIAGNOSIS — M545 Low back pain: Secondary | ICD-10-CM | POA: Diagnosis not present

## 2017-04-24 DIAGNOSIS — N898 Other specified noninflammatory disorders of vagina: Secondary | ICD-10-CM | POA: Diagnosis not present

## 2017-04-24 DIAGNOSIS — R300 Dysuria: Secondary | ICD-10-CM | POA: Diagnosis not present

## 2017-06-20 ENCOUNTER — Ambulatory Visit: Payer: PRIVATE HEALTH INSURANCE

## 2017-06-29 ENCOUNTER — Ambulatory Visit: Payer: PRIVATE HEALTH INSURANCE | Admitting: Family Medicine

## 2017-07-12 ENCOUNTER — Other Ambulatory Visit: Payer: Self-pay | Admitting: Obstetrics & Gynecology

## 2017-07-12 DIAGNOSIS — Z139 Encounter for screening, unspecified: Secondary | ICD-10-CM

## 2017-07-20 ENCOUNTER — Ambulatory Visit (INDEPENDENT_AMBULATORY_CARE_PROVIDER_SITE_OTHER): Payer: 59 | Admitting: Family Medicine

## 2017-07-20 ENCOUNTER — Encounter: Payer: Self-pay | Admitting: Family Medicine

## 2017-07-20 ENCOUNTER — Other Ambulatory Visit (HOSPITAL_COMMUNITY)
Admission: RE | Admit: 2017-07-20 | Discharge: 2017-07-20 | Disposition: A | Discharge status: Home / Self Care | Payer: 59 | Source: Ambulatory Visit | Attending: Family Medicine | Admitting: Family Medicine

## 2017-07-20 VITALS — BP 120/90 | HR 74 | Temp 98.0°F | Ht 61.5 in | Wt 144.4 lb

## 2017-07-20 DIAGNOSIS — K0381 Cracked tooth: Secondary | ICD-10-CM

## 2017-07-20 DIAGNOSIS — N898 Other specified noninflammatory disorders of vagina: Secondary | ICD-10-CM | POA: Diagnosis not present

## 2017-07-20 DIAGNOSIS — Z7689 Persons encountering health services in other specified circumstances: Secondary | ICD-10-CM | POA: Diagnosis not present

## 2017-07-20 DIAGNOSIS — R030 Elevated blood-pressure reading, without diagnosis of hypertension: Secondary | ICD-10-CM

## 2017-07-20 DIAGNOSIS — J302 Other seasonal allergic rhinitis: Secondary | ICD-10-CM | POA: Diagnosis not present

## 2017-07-20 MED ORDER — FLUCONAZOLE 150 MG PO TABS: 150.0000 mg | ORAL_TABLET | Freq: Once | ORAL | 0 refills | Status: AC

## 2017-07-20 NOTE — Patient Instructions (Addendum)
DASH Eating Plan DASH stands for "Dietary Approaches to Stop Hypertension." The DASH eating plan is a healthy eating plan that has been shown to reduce high blood pressure (hypertension). It may also reduce your risk for type 2 diabetes, heart disease, and stroke. The DASH eating plan may also help with weight loss. What are tips for following this plan? General guidelines  Avoid eating more than 2,300 mg (milligrams) of salt (sodium) a day. If you have hypertension, you may need to reduce your sodium intake to 1,500 mg a day.  Limit alcohol intake to no more than 1 drink a day for nonpregnant women and 2 drinks a day for men. One drink equals 12 oz of beer, 5 oz of wine, or 1 oz of hard liquor.  Work with your health care provider to maintain a healthy body weight or to lose weight. Ask what an ideal weight is for you.  Get at least 30 minutes of exercise that causes your heart to beat faster (aerobic exercise) most days of the week. Activities may include walking, swimming, or biking.  Work with your health care provider or diet and nutrition specialist (dietitian) to adjust your eating plan to your individual calorie needs. Reading food labels  Check food labels for the amount of sodium per serving. Choose foods with less than 5 percent of the Daily Value of sodium. Generally, foods with less than 300 mg of sodium per serving fit into this eating plan.  To find whole grains, look for the word "whole" as the first word in the ingredient list. Shopping  Buy products labeled as "low-sodium" or "no salt added."  Buy fresh foods. Avoid canned foods and premade or frozen meals. Cooking  Avoid adding salt when cooking. Use salt-free seasonings or herbs instead of table salt or sea salt. Check with your health care provider or pharmacist before using salt substitutes.  Do not fry foods. Cook foods using healthy methods such as baking, boiling, grilling, and broiling instead.  Cook with  heart-healthy oils, such as olive, canola, soybean, or sunflower oil. Meal planning   Eat a balanced diet that includes: ? 5 or more servings of fruits and vegetables each day. At each meal, try to fill half of your plate with fruits and vegetables. ? Up to 6-8 servings of whole grains each day. ? Less than 6 oz of lean meat, poultry, or fish each day. A 3-oz serving of meat is about the same size as a deck of cards. One egg equals 1 oz. ? 2 servings of low-fat dairy each day. ? A serving of nuts, seeds, or beans 5 times each week. ? Heart-healthy fats. Healthy fats called Omega-3 fatty acids are found in foods such as flaxseeds and coldwater fish, like sardines, salmon, and mackerel.  Limit how much you eat of the following: ? Canned or prepackaged foods. ? Food that is high in trans fat, such as fried foods. ? Food that is high in saturated fat, such as fatty meat. ? Sweets, desserts, sugary drinks, and other foods with added sugar. ? Full-fat dairy products.  Do not salt foods before eating.  Try to eat at least 2 vegetarian meals each week.  Eat more home-cooked food and less restaurant, buffet, and fast food.  When eating at a restaurant, ask that your food be prepared with less salt or no salt, if possible. What foods are recommended? The items listed may not be a complete list. Talk with your dietitian about what   dietary choices are best for you. Grains Whole-grain or whole-wheat bread. Whole-grain or whole-wheat pasta. Brown rice. Oatmeal. Quinoa. Bulgur. Whole-grain and low-sodium cereals. Pita bread. Low-fat, low-sodium crackers. Whole-wheat flour tortillas. Vegetables Fresh or frozen vegetables (raw, steamed, roasted, or grilled). Low-sodium or reduced-sodium tomato and vegetable juice. Low-sodium or reduced-sodium tomato sauce and tomato paste. Low-sodium or reduced-sodium canned vegetables. Fruits All fresh, dried, or frozen fruit. Canned fruit in natural juice (without  added sugar). Meat and other protein foods Skinless chicken or turkey. Ground chicken or turkey. Pork with fat trimmed off. Fish and seafood. Egg whites. Dried beans, peas, or lentils. Unsalted nuts, nut butters, and seeds. Unsalted canned beans. Lean cuts of beef with fat trimmed off. Low-sodium, lean deli meat. Dairy Low-fat (1%) or fat-free (skim) milk. Fat-free, low-fat, or reduced-fat cheeses. Nonfat, low-sodium ricotta or cottage cheese. Low-fat or nonfat yogurt. Low-fat, low-sodium cheese. Fats and oils Soft margarine without trans fats. Vegetable oil. Low-fat, reduced-fat, or light mayonnaise and salad dressings (reduced-sodium). Canola, safflower, olive, soybean, and sunflower oils. Avocado. Seasoning and other foods Herbs. Spices. Seasoning mixes without salt. Unsalted popcorn and pretzels. Fat-free sweets. What foods are not recommended? The items listed may not be a complete list. Talk with your dietitian about what dietary choices are best for you. Grains Baked goods made with fat, such as croissants, muffins, or some breads. Dry pasta or rice meal packs. Vegetables Creamed or fried vegetables. Vegetables in a cheese sauce. Regular canned vegetables (not low-sodium or reduced-sodium). Regular canned tomato sauce and paste (not low-sodium or reduced-sodium). Regular tomato and vegetable juice (not low-sodium or reduced-sodium). Pickles. Olives. Fruits Canned fruit in a light or heavy syrup. Fried fruit. Fruit in cream or butter sauce. Meat and other protein foods Fatty cuts of meat. Ribs. Fried meat. Bacon. Sausage. Bologna and other processed lunch meats. Salami. Fatback. Hotdogs. Bratwurst. Salted nuts and seeds. Canned beans with added salt. Canned or smoked fish. Whole eggs or egg yolks. Chicken or turkey with skin. Dairy Whole or 2% milk, cream, and half-and-half. Whole or full-fat cream cheese. Whole-fat or sweetened yogurt. Full-fat cheese. Nondairy creamers. Whipped toppings.  Processed cheese and cheese spreads. Fats and oils Butter. Stick margarine. Lard. Shortening. Ghee. Bacon fat. Tropical oils, such as coconut, palm kernel, or palm oil. Seasoning and other foods Salted popcorn and pretzels. Onion salt, garlic salt, seasoned salt, table salt, and sea salt. Worcestershire sauce. Tartar sauce. Barbecue sauce. Teriyaki sauce. Soy sauce, including reduced-sodium. Steak sauce. Canned and packaged gravies. Fish sauce. Oyster sauce. Cocktail sauce. Horseradish that you find on the shelf. Ketchup. Mustard. Meat flavorings and tenderizers. Bouillon cubes. Hot sauce and Tabasco sauce. Premade or packaged marinades. Premade or packaged taco seasonings. Relishes. Regular salad dressings. Where to find more information:  National Heart, Lung, and Blood Institute: www.nhlbi.nih.gov  American Heart Association: www.heart.org Summary  The DASH eating plan is a healthy eating plan that has been shown to reduce high blood pressure (hypertension). It may also reduce your risk for type 2 diabetes, heart disease, and stroke.  With the DASH eating plan, you should limit salt (sodium) intake to 2,300 mg a day. If you have hypertension, you may need to reduce your sodium intake to 1,500 mg a day.  When on the DASH eating plan, aim to eat more fresh fruits and vegetables, whole grains, lean proteins, low-fat dairy, and heart-healthy fats.  Work with your health care provider or diet and nutrition specialist (dietitian) to adjust your eating plan to your individual   calorie needs. This information is not intended to replace advice given to you by your health care provider. Make sure you discuss any questions you have with your health care provider. Document Released: 06/16/2011 Document Revised: 06/20/2016 Document Reviewed: 06/20/2016 Elsevier Interactive Patient Education  2018 [employer].  How to Take Your Blood Pressure You can take your blood pressure at home with a machine. You  may need to check your blood pressure at home:  To check if you have high blood pressure (hypertension).  To check your blood pressure over time.  To make sure your blood pressure medicine is working.  Supplies needed: You will need a blood pressure machine, or monitor. You can buy one at a drugstore or online. When choosing one:  Choose one with an arm cuff.  Choose one that wraps around your upper arm. Only one finger should fit between your arm and the cuff.  Do not choose one that measures your blood pressure from your wrist or finger.  Your doctor can suggest a monitor. How to prepare Avoid these things for 30 minutes before checking your blood pressure:  Drinking caffeine.  Drinking alcohol.  Eating.  Smoking.  Exercising.  Five minutes before checking your blood pressure:  Pee.  Sit in a dining chair. Avoid sitting in a soft couch or armchair.  Be quiet. Do not talk.  How to take your blood pressure Follow the instructions that came with your machine. If you have a digital blood pressure monitor, these may be the instructions: 1. Sit up straight. 2. Place your feet on the floor. Do not cross your ankles or legs. 3. Rest your left arm at the level of your heart. You may rest it on a table, desk, or chair. 4. Pull up your shirt sleeve. 5. Wrap the blood pressure cuff around the upper part of your left arm. The cuff should be 1 inch (2.5 cm) above your elbow. It is best to wrap the cuff around bare skin. 6. Fit the cuff snugly around your arm. You should be able to place only one finger between the cuff and your arm. 7. Put the cord inside the groove of your elbow. 8. Press the power button. 9. Sit quietly while the cuff fills with air and loses air. 10. Write down the numbers on the screen. 11. Wait 2-3 minutes and then repeat steps 1-10.  What do the numbers mean? Two numbers make up your blood pressure. The first number is called systolic pressure. The  second is called diastolic pressure. An example of a blood pressure reading is "120 over 80" (or 120/80). If you are an adult and do not have a medical condition, use this guide to find out if your blood pressure is normal: Normal  First number: below 120.  Second number: below 80. Elevated  First number: 120-129.  Second number: below 80. Hypertension stage 1  First number: 130-139.  Second number: 80-89. Hypertension stage 2  First number: 140 or above.  Second number: 66 or above. Your blood pressure is above normal even if only the top or bottom number is above normal. Follow these instructions at home:  Check your blood pressure as often as your doctor tells you to.  Take your monitor to your next doctor's appointment. Your doctor will: ? Make sure you are using it correctly. ? Make sure it is working right.  Make sure you understand what your blood pressure numbers should be.  Tell your doctor if your  medicines are causing side effects. Contact a doctor if:  Your blood pressure keeps being high. Get help right away if:  Your first blood pressure number is higher than 180.  Your second blood pressure number is higher than 120. This information is not intended to replace advice given to you by your health care provider. Make sure you discuss any questions you have with your health care provider. Document Released: 06/09/2008 Document Revised: 05/25/2016 Document Reviewed: 12/04/2015 Elsevier Interactive Patient Education  2018 [employer].  Vaginitis Vaginitis is an inflammation of the vagina. It can happen when the normal bacteria and yeast in the vagina grow too much. There are different types. Treatment will depend on the type you have. Follow these instructions at home:  Take all medicines as told by your doctor.  Keep your vagina area clean and dry. Avoid soap. Rinse the area with water.  Avoid washing and cleaning out the vagina (douching).  Do not  use tampons or have sex (intercourse) until your treatment is done.  Wipe from front to back after going to the restroom.  Wear cotton underwear.  Avoid wearing underwear while you sleep until your vaginitis is gone.  Avoid tight pants. Avoid underwear or nylons without a cotton panel.  Take off wet clothing (such as a bathing suit) as soon as you can.  Use mild, unscented products. Avoid fabric softeners and scented: ? Feminine sprays. ? Laundry detergents. ? Tampons. ? Soaps or bubble baths.  Practice safe sex and use condoms. Get help right away if:  You have belly (abdominal) pain.  You have a fever or lasting symptoms for more than 2-3 days.  You have a fever and your symptoms suddenly get worse. This information is not intended to replace advice given to you by your health care provider. Make sure you discuss any questions you have with your health care provider. Document Released: 09/23/2008 Document Revised: 12/03/2015 Document Reviewed: 12/08/2011 Elsevier Interactive Patient Education  2017 [place].  Tooth Injuries Tooth injuries (tooth trauma) include cracked or broken teeth (fractures), teeth that have been moved out of place or dislodged (luxations), and knocked-out teeth (avulsions). A tooth injury often needs to be treated quickly to save the tooth. If it is not possible to save a tooth after an injury, the tooth may need to be removed (extracted). Tooth injuries may be caused by any force that is strong enough to chip, break, dislodge, or knock out a tooth. Follow these instructions at home:  Take medicines only as told by your dentist or doctor.  Keep all follow-up visits as told by your dentist or doctor. This is important.  Do not eat or chew on very hard objects. These include ice cubes, pens, pencils, hard candy, and popcorn kernels.  Do not clench or grind your teeth. Tell your dentist or doctor if you grind your teeth while you sleep.  Apply  ice to your mouth near the injured tooth as told by your dentist or doctor.  Rinse your mouth with salt water as told by your dentist or doctor.  Do not use your teeth to open packages.  Always wear mouth protection when you play contact sports. Contact a doctor if:  You continue to have tooth pain after a tooth injury.  Your tooth hurts or feels bad (is sensitive) because of something hot or cold.  You develop swelling near your injured tooth.  You have a fever.  You are not able to open your jaw.  You are drooling and it is getting worse. This information is not intended to replace advice given to you by your health care provider. Make sure you discuss any questions you have with your health care provider. Document Released: 12/15/2009 Document Revised: 12/03/2015 Document Reviewed: 06/23/2014 Elsevier Interactive Patient Education  [employer].

## 2017-07-20 NOTE — Progress Notes (Signed)
Patient presents to clinic today for acute concern and to establish care.  SUBJECTIVE: PMH: Patient is a 52 year old female with no significant past medical history.  Patient states she has not had a PCP.  Pt seen by Rushie Goltz, Dr. Deatra Ina.  Vaginal discomfort: -Patient endorses discomfort for several days -Patient denies increase in vaginal discharge.  States is having clearish white discharge. -Patient denies increased itching, odor, burning. -Patient endorses not always wiping after urination. -Patient endorses drinking sodas, juices but no water per day. -LMP 06/19/17.  Patient does endorse breast soreness/heaviness -last pap 08/2016  H/o HTN: -Patient states in 2010 was on meds for high blood pressure -At this time she was under increased stress as her fianc died. -After this she did well without medication. -Patient does not check BP at home. -Patient does not exercise and drinks minimal water per day. -Patient endorses eating a lot of fast food.  Seasonal allergies: -Patient takes Allegra as needed -Patient endorses sneezing and watery eyes in the spring and winter  Broken tooth: -Patient states she was eating ice at work last night when she broke a tooth. -Patient states she needs to go to a dentist. -Patient denies pain at this time. Allergies: NKDA  Past surgical history: Tonsillectomy Fibroidectomy  Social history: Patient is single.  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  Patient denies tobacco or drug use.  Patient endorses rare alcohol use.   Past Medical History:  Diagnosis Date  . Fibroids     Past Surgical History:  Procedure Laterality Date  . MYOMECTOMY ABDOMINAL APPROACH    . TONSILLECTOMY      No current outpatient medications on file prior to visit.   No current facility-administered medications on file prior to visit.     No Known Allergies  Family History  Problem Relation Age of Onset    . Diabetes Mother   . Hypertension Mother   . Diabetes Father   . Hypertension Father     Social History   Socioeconomic History  . Marital status: Single    Spouse name: Not on file  . Number of children: Not on file  . Years of education: Not on file  . Highest education level: Not on file  Social Needs  . Financial resource strain: Not on file  . Food insecurity - worry: Not on file  . Food insecurity - inability: Not on file  . Transportation needs - medical: Not on file  . Transportation needs - non-medical: Not on file  Occupational History  . Not on file  Tobacco Use  . Smoking status: Former Research scientist (life sciences)  . Smokeless tobacco: Never Used  Substance and Sexual Activity  . Alcohol use: Yes    Comment: occass.   . Drug use: No  . Sexual activity: Yes  Other Topics Concern  . Not on file  Social History Narrative  . Not on file    ROS General: Denies fever, chills, night sweats, changes in weight, changes in appetite HEENT: Denies headaches, ear pain, changes in vision, rhinorrhea, sore throat  +broken tooth CV: Denies CP, palpitations, SOB, orthopnea Pulm: Denies SOB, cough, wheezing GI: Denies abdominal pain, nausea, vomiting, diarrhea, constipation GU: Denies dysuria, hematuria, frequency,   + vaginal irritation Msk: Denies muscle cramps, joint pains Neuro: Denies weakness, numbness, tingling Skin: Denies rashes, bruising Psych: Denies depression, anxiety, hallucinations  BP 140/90 (BP Location: Right Arm, Patient Position: Sitting, Cuff Size: Normal)   Pulse  74   Temp 98 F (36.7 C) (Oral)   Ht 5' 1.5" (1.562 m)   Wt 144 lb 6.4 oz (65.5 kg)   BMI 26.84 kg/m   Physical Exam Gen. Pleasant, well developed, well-nourished, in NAD HEENT - [place]/AT, PERRL, no scleral icterus, no nasal drainage, pharynx without erythema or exudate. Numerous fillings, broken molar. Lungs: no use of accessory muscles, CTAB, no wheezes, rales or rhonchi Cardiovascular: RRR, No  r/g/m, no peripheral edema Abdomen: BS present, soft, nontender,nondistended Neuro:  A&Ox3, CN II-XII intact, normal gait Skin:  Warm, dry, intact, no lesions Psych: normal affect, mood appropriate GU: normal external female genitalia no lesions or rash.  Mild erythema noted at introitus.  Thin clearish white discharge present.  Sample collected via swab.  No results found for this or any previous visit (from the past 2160 hour(s)).  Assessment/Plan: Vaginal irritation -normal external genitalia with scan d/c -sample obtained and sent to lab. -discussed proper feminine hygiene -Patient encouraged to increase p.o. intake of water  - Plan: fluconazole (DIFLUCAN) 150 MG tablet  Seasonal allergies -Continue Allegra as needed  Elevated BP without diagnosis of hypertension -BP elevated this visit 140/90 -Patient encouraged to purchase BP cuff for home use and keep a log. -Patient advised on lifestyle modifications including decreased sodium intake, increasing p.o. intake of water and increasing physical activity. -We will have patient follow-up in 1 month for recheck.  We will also obtain labs at this time.  Broken or cracked tooth, nontraumatic -Patient advised to see a dentist. -Patient given a list of area dental providers that see patients with little or no insurance.  Encounter to establish care -We reviewed the PMH, PSH, FH, SH, Meds and Allergies. -We provided refills for any medications we will prescribe as needed. -We addressed current concerns per orders and patient instructions. -We have asked for records for pertinent exams, studies, vaccines and notes from previous providers. -We have advised patient to follow up per instructions below.  Will have patient follow-up in 1 month for BP recheck.  We will also need to schedule CPE/labs.  Patient interested in checking hemoglobin A1c but wishes to wait at this time.   Grier Mitts, MD

## 2017-07-20 NOTE — Addendum Note (Signed)
Addended by: Milford Cage on: 07/20/2017 10:21 AM   Modules accepted: Orders

## 2017-07-21 ENCOUNTER — Telehealth: Payer: Self-pay | Admitting: Family Medicine

## 2017-07-21 NOTE — Telephone Encounter (Signed)
Left a VM regarding patient wanting lab results that her labs results are not back yet and as soon as they are back someone from the office will give her a call.

## 2017-07-21 NOTE — Telephone Encounter (Signed)
Copied from [place] [phone]. Topic: Quick Communication - See Telephone Encounter >> Jul 21, 2017  9:35 AM Ahmed Prima L wrote: CRM for notification. See Telephone encounter for:   07/21/17.  Requesting her labs from 07/21/17

## 2017-07-24 LAB — CERVICOVAGINAL ANCILLARY ONLY
Bacterial vaginitis: NEGATIVE
CHLAMYDIA, DNA PROBE: NEGATIVE
Candida vaginitis: POSITIVE — AB
Neisseria Gonorrhea: NEGATIVE
Trichomonas: NEGATIVE

## 2017-08-07 DIAGNOSIS — H5213 Myopia, bilateral: Secondary | ICD-10-CM | POA: Diagnosis not present

## 2017-08-25 ENCOUNTER — Ambulatory Visit: Payer: 59 | Admitting: Family Medicine

## 2017-09-18 ENCOUNTER — Ambulatory Visit: Payer: 59 | Admitting: Family Medicine

## 2017-10-02 ENCOUNTER — Ambulatory Visit (INDEPENDENT_AMBULATORY_CARE_PROVIDER_SITE_OTHER): Payer: 59 | Admitting: Family Medicine

## 2017-10-02 ENCOUNTER — Encounter: Payer: Self-pay | Admitting: Family Medicine

## 2017-10-02 VITALS — BP 108/70 | HR 76 | Temp 98.1°F | Ht 61.5 in | Wt 135.0 lb

## 2017-10-02 DIAGNOSIS — Z13220 Encounter for screening for lipoid disorders: Secondary | ICD-10-CM | POA: Diagnosis not present

## 2017-10-02 DIAGNOSIS — Z0000 Encounter for general adult medical examination without abnormal findings: Secondary | ICD-10-CM | POA: Diagnosis not present

## 2017-10-02 DIAGNOSIS — Z1211 Encounter for screening for malignant neoplasm of colon: Secondary | ICD-10-CM

## 2017-10-02 DIAGNOSIS — Z131 Encounter for screening for diabetes mellitus: Secondary | ICD-10-CM

## 2017-10-02 DIAGNOSIS — Z1329 Encounter for screening for other suspected endocrine disorder: Secondary | ICD-10-CM | POA: Diagnosis not present

## 2017-10-02 LAB — BASIC METABOLIC PANEL
BUN: 11 mg/dL (ref 6–23)
CO2: 28 meq/L (ref 19–32)
Calcium: 9.3 mg/dL (ref 8.4–10.5)
Chloride: 104 mEq/L (ref 96–112)
Creatinine, Ser: 0.5 mg/dL (ref 0.40–1.20)
GFR: 165.94 mL/min (ref >60.00)
GLUCOSE: 94 mg/dL (ref 70–99)
POTASSIUM: 4.2 meq/L (ref 3.5–5.1)
SODIUM: 138 meq/L (ref 135–145)

## 2017-10-02 LAB — CBC WITH DIFFERENTIAL/PLATELET
Basophils Absolute: 0.1 10*3/uL (ref 0.0–0.1)
Basophils Relative: 0.9 % (ref 0.0–3.0)
EOS ABS: 0.1 10*3/uL (ref 0.0–0.7)
Eosinophils Relative: 2 % (ref 0.0–5.0)
HCT: 42.6 % (ref 36.0–46.0)
HEMOGLOBIN: 14.3 g/dL (ref 12.0–15.0)
Lymphocytes Relative: 30.4 % (ref 12.0–46.0)
Lymphs Abs: 1.9 10*3/uL (ref 0.7–4.0)
MCHC: 33.6 g/dL (ref 30.0–36.0)
MCV: 86.4 fl (ref 78.0–100.0)
MONO ABS: 0.4 10*3/uL (ref 0.1–1.0)
Monocytes Relative: 6.7 % (ref 3.0–12.0)
NEUTROS PCT: 60 % (ref 43.0–77.0)
Neutro Abs: 3.8 10*3/uL (ref 1.4–7.7)
Platelets: 311 10*3/uL (ref 150.0–400.0)
RBC: 4.94 Mil/uL (ref 3.87–5.11)
RDW: 13.4 % (ref 11.5–15.5)
WBC: 6.3 10*3/uL (ref 4.0–10.5)

## 2017-10-02 LAB — LIPID PANEL
CHOLESTEROL: 126 mg/dL (ref 0–200)
HDL: 50.7 mg/dL (ref >39.00)
LDL Cholesterol: 59 mg/dL (ref 0–99)
NonHDL: 75.71
Total CHOL/HDL Ratio: 2
Triglycerides: 82 mg/dL (ref 0.0–149.0)
VLDL: 16.4 mg/dL (ref 0.0–40.0)

## 2017-10-02 LAB — TSH: TSH: 2.77 u[IU]/mL (ref 0.35–4.50)

## 2017-10-02 LAB — HEMOGLOBIN A1C: HEMOGLOBIN A1C: 5.6 % (ref 4.6–6.5)

## 2017-10-02 LAB — T4, FREE: FREE T4: 0.68 ng/dL (ref 0.60–1.60)

## 2017-10-02 NOTE — Progress Notes (Signed)
Subjective:    Patient ID: [name], female    DOB: [birth year], 52 y.o.   MRN: [id number]  Chief Complaint  Patient presents with  . Follow-up    HPI Patient was seen today for CPE.  Patient states she has been doing well.  She has lost approximately 9 pounds and her blood pressure is better controlled since last OFV.  Patient states she would like to have labs checked.  Patient has an upcoming visit to OB/GYN in April for Pap.  Patient also has her mammogram scheduled for April.  Past Medical History:  Diagnosis Date  . Fibroids     Allergies  Allergen Reactions  . Penicillins Hives    ROS General: Denies fever, chills, night sweats, changes in weight, changes in appetite HEENT: Denies headaches, ear pain, changes in vision, rhinorrhea, sore throat CV: Denies CP, palpitations, SOB, orthopnea Pulm: Denies SOB, cough, wheezing GI: Denies abdominal pain, nausea, vomiting, diarrhea, constipation GU: Denies dysuria, hematuria, frequency, vaginal discharge Msk: Denies muscle cramps, joint pains Neuro: Denies weakness, numbness, tingling Skin: Denies rashes, bruising Psych: Denies depression, anxiety, hallucinations     Objective:    Blood pressure 108/70, pulse 76, temperature 98.1 F (36.7 C), temperature source Oral, height 5' 1.5" (1.562 m), weight 135 lb (61.2 kg), SpO2 98 %.   Gen. Pleasant, well-nourished, in no distress, normal affect   HEENT: Wearing glasses, [place]/AT, face symmetric, no scleral icterus, PERRLA,  nares patent without drainage, pharynx without erythema or exudate.  TMs normal bilaterally.  No cervical lymphadenopathy. Neck: No JVD, no thyromegaly, no carotid bruits Lungs: no accessory muscle use, CTAB, no wheezes or rales Cardiovascular: RRR, no m/r/g, no peripheral edema Abdomen: BS present, soft, NT/ND, no hepatosplenomegaly. Musculoskeletal: No deformities, no cyanosis or clubbing, normal tone Neuro:  A&Ox3, CN II-XII intact, normal gait Skin:   Warm, no lesions/ rash   Wt Readings from Last 3 Encounters:  10/02/17 135 lb (61.2 kg)  07/20/17 144 lb 6.4 oz (65.5 kg)  05/24/11 140 lb (63.5 kg)    Lab Results  Component Value Date   WBC 8.6 04/17/2007   HGB 13.1 04/17/2007   HCT 38.4 04/17/2007   PLT 292 04/17/2007    Assessment/Plan:  Well adult exam  -Anticipatory guidance given including wearing seatbelts, smoke detectors in the home, increasing physical activity, increasing p.o. intake of water, increasing p.o. intake of vegetables. -Patient followed by OB/GYN will have Pap done in April by them -Patient to schedule mammogram also to be done in April -We will obtain labs this visit - Plan: CBC with Differential/Platelet, Basic metabolic panel -Next CPE in 1 year  Screening for cholesterol level - Plan: Lipid panel  Screening for diabetes mellitus - Plan: Hemoglobin A1c  Screening for thyroid disorder - Plan: TSH, T4, Free  Screen for colon cancer - Plan: Ambulatory referral to Gastroenterology  Follow-up PRN  Grier Mitts, MD

## 2017-10-02 NOTE — Patient Instructions (Signed)
Preventive Care 40-64 Years, Female Preventive care refers to lifestyle choices and visits with your health care provider that can promote health and wellness. What does preventive care include?  A yearly physical exam. This is also called an annual well check.  Dental exams once or twice a year.  Routine eye exams. Ask your health care provider how often you should have your eyes checked.  Personal lifestyle choices, including: ? Daily care of your teeth and gums. ? Regular physical activity. ? Eating a healthy diet. ? Avoiding tobacco and drug use. ? Limiting alcohol use. ? Practicing safe sex. ? Taking low-dose aspirin daily starting at age 58. ? Taking vitamin and mineral supplements as recommended by your health care provider. What happens during an annual well check? The services and screenings done by your health care provider during your annual well check will depend on your age, overall health, lifestyle risk factors, and family history of disease. Counseling Your health care provider may ask you questions about your:  Alcohol use.  Tobacco use.  Drug use.  Emotional well-being.  Home and relationship well-being.  Sexual activity.  Eating habits.  Work and work Statistician.  Method of birth control.  Menstrual cycle.  Pregnancy history.  Screening You may have the following tests or measurements:  Height, weight, and BMI.  Blood pressure.  Lipid and cholesterol levels. These may be checked every 5 years, or more frequently if you are over 81 years old.  Skin check.  Lung cancer screening. You may have this screening every year starting at age 78 if you have a 30-pack-year history of smoking and currently smoke or have quit within the past 15 years.  Fecal occult blood test (FOBT) of the stool. You may have this test every year starting at age 65.  Flexible sigmoidoscopy or colonoscopy. You may have a sigmoidoscopy every 5 years or a colonoscopy  every 10 years starting at age 30.  Hepatitis C blood test.  Hepatitis B blood test.  Sexually transmitted disease (STD) testing.  Diabetes screening. This is done by checking your blood sugar (glucose) after you have not eaten for a while (fasting). You may have this done every 1-3 years.  Mammogram. This may be done every 1-2 years. Talk to your health care provider about when you should start having regular mammograms. This may depend on whether you have a family history of breast cancer.  BRCA-related cancer screening. This may be done if you have a family history of breast, ovarian, tubal, or peritoneal cancers.  Pelvic exam and Pap test. This may be done every 3 years starting at age 80. Starting at age 36, this may be done every 5 years if you have a Pap test in combination with an HPV test.  Bone density scan. This is done to screen for osteoporosis. You may have this scan if you are at high risk for osteoporosis.  Discuss your test results, treatment options, and if necessary, the need for more tests with your health care provider. Vaccines Your health care provider may recommend certain vaccines, such as:  Influenza vaccine. This is recommended every year.  Tetanus, diphtheria, and acellular pertussis (Tdap, Td) vaccine. You may need a Td booster every 10 years.  Varicella vaccine. You may need this if you have not been vaccinated.  Zoster vaccine. You may need this after age 5.  Measles, mumps, and rubella (MMR) vaccine. You may need at least one dose of MMR if you were born in  1958 or later. You may also need a second dose.  Pneumococcal 13-valent conjugate (PCV13) vaccine. You may need this if you have certain conditions and were not previously vaccinated.  Pneumococcal polysaccharide (PPSV23) vaccine. You may need one or two doses if you smoke cigarettes or if you have certain conditions.  Meningococcal vaccine. You may need this if you have certain  conditions.  Hepatitis A vaccine. You may need this if you have certain conditions or if you travel or work in places where you may be exposed to hepatitis A.  Hepatitis B vaccine. You may need this if you have certain conditions or if you travel or work in places where you may be exposed to hepatitis B.  Haemophilus influenzae type b (Hib) vaccine. You may need this if you have certain conditions.  Talk to your health care provider about which screenings and vaccines you need and how often you need them. This information is not intended to replace advice given to you by your health care provider. Make sure you discuss any questions you have with your health care provider. Document Released: 07/24/2015 Document Revised: 03/16/2016 Document Reviewed: 04/28/2015 Elsevier Interactive Patient Education  2018 [employer] Inc[employer]  

## 2017-10-03 ENCOUNTER — Encounter: Payer: Self-pay | Admitting: Family Medicine

## 2017-10-03 ENCOUNTER — Encounter: Payer: Self-pay | Admitting: Internal Medicine

## 2017-10-25 ENCOUNTER — Ambulatory Visit
Admission: RE | Admit: 2017-10-25 | Discharge: 2017-10-25 | Disposition: A | Discharge status: Home / Self Care | Payer: 59 | Source: Ambulatory Visit | Attending: Obstetrics & Gynecology | Admitting: Obstetrics & Gynecology

## 2017-10-25 DIAGNOSIS — Z1231 Encounter for screening mammogram for malignant neoplasm of breast: Secondary | ICD-10-CM | POA: Diagnosis not present

## 2017-10-25 DIAGNOSIS — Z139 Encounter for screening, unspecified: Secondary | ICD-10-CM

## 2017-10-26 ENCOUNTER — Other Ambulatory Visit: Payer: Self-pay | Admitting: Obstetrics & Gynecology

## 2017-10-26 DIAGNOSIS — R928 Other abnormal and inconclusive findings on diagnostic imaging of breast: Secondary | ICD-10-CM

## 2017-10-30 ENCOUNTER — Ambulatory Visit
Admission: RE | Admit: 2017-10-30 | Discharge: 2017-10-30 | Disposition: A | Discharge status: Home / Self Care | Payer: 59 | Source: Ambulatory Visit | Attending: Obstetrics & Gynecology | Admitting: Obstetrics & Gynecology

## 2017-10-30 ENCOUNTER — Other Ambulatory Visit: Payer: Self-pay | Admitting: Obstetrics & Gynecology

## 2017-10-30 DIAGNOSIS — N6320 Unspecified lump in the left breast, unspecified quadrant: Secondary | ICD-10-CM

## 2017-10-30 DIAGNOSIS — R928 Other abnormal and inconclusive findings on diagnostic imaging of breast: Secondary | ICD-10-CM

## 2017-10-30 DIAGNOSIS — R922 Inconclusive mammogram: Secondary | ICD-10-CM | POA: Diagnosis not present

## 2017-10-30 DIAGNOSIS — N6322 Unspecified lump in the left breast, upper inner quadrant: Secondary | ICD-10-CM | POA: Diagnosis not present

## 2017-11-01 DIAGNOSIS — Z01419 Encounter for gynecological examination (general) (routine) without abnormal findings: Secondary | ICD-10-CM | POA: Diagnosis not present

## 2017-11-01 DIAGNOSIS — Z124 Encounter for screening for malignant neoplasm of cervix: Secondary | ICD-10-CM | POA: Diagnosis not present

## 2017-11-02 ENCOUNTER — Other Ambulatory Visit: Payer: Self-pay | Admitting: Obstetrics & Gynecology

## 2017-11-09 ENCOUNTER — Ambulatory Visit
Admission: RE | Admit: 2017-11-09 | Discharge: 2017-11-09 | Disposition: A | Discharge status: Home / Self Care | Payer: 59 | Source: Ambulatory Visit | Attending: Obstetrics & Gynecology | Admitting: Obstetrics & Gynecology

## 2017-11-09 DIAGNOSIS — D242 Benign neoplasm of left breast: Secondary | ICD-10-CM | POA: Diagnosis not present

## 2017-11-09 DIAGNOSIS — N6322 Unspecified lump in the left breast, upper inner quadrant: Secondary | ICD-10-CM | POA: Diagnosis not present

## 2017-12-18 ENCOUNTER — Encounter: Payer: 59 | Admitting: Internal Medicine

## 2018-01-18 ENCOUNTER — Ambulatory Visit (INDEPENDENT_AMBULATORY_CARE_PROVIDER_SITE_OTHER): Payer: 59 | Admitting: Family Medicine

## 2018-01-18 ENCOUNTER — Encounter: Payer: Self-pay | Admitting: Family Medicine

## 2018-01-18 VITALS — BP 132/88 | HR 70 | Temp 98.3°F | Wt 135.0 lb

## 2018-01-18 DIAGNOSIS — N926 Irregular menstruation, unspecified: Secondary | ICD-10-CM | POA: Diagnosis not present

## 2018-01-18 DIAGNOSIS — N951 Menopausal and female climacteric states: Secondary | ICD-10-CM

## 2018-01-18 LAB — POCT URINE PREGNANCY: Preg Test, Ur: NEGATIVE

## 2018-01-18 NOTE — Patient Instructions (Signed)
Perimenopause Perimenopause is the time when your body begins to move into the menopause (no menstrual period for 12 straight months). It is a natural process. Perimenopause can begin 2-8 years before the menopause and usually lasts for 1 year after the menopause. During this time, your ovaries may or may not produce an egg. The ovaries vary in their production of estrogen and progesterone hormones each month. This can cause irregular menstrual periods, difficulty getting pregnant, vaginal bleeding between periods, and uncomfortable symptoms. What are the causes?  Irregular production of the ovarian hormones, estrogen and progesterone, and not ovulating every month. Other causes include:  Tumor of the pituitary gland in the brain.  Medical disease that affects the ovaries.  Radiation treatment.  Chemotherapy.  Unknown causes.  Heavy smoking and excessive alcohol intake can bring on perimenopause sooner.  What are the signs or symptoms?  Hot flashes.  Night sweats.  Irregular menstrual periods.  Decreased sex drive.  Vaginal dryness.  Headaches.  Mood swings.  Depression.  Memory problems.  Irritability.  Tiredness.  Weight gain.  Trouble getting pregnant.  The beginning of losing bone cells (osteoporosis).  The beginning of hardening of the arteries (atherosclerosis). How is this diagnosed? Your health care provider will make a diagnosis by analyzing your age, menstrual history, and symptoms. He or she will do a physical exam and note any changes in your body, especially your female organs. Female hormone tests may or may not be helpful depending on the amount of female hormones you produce and when you produce them. However, other hormone tests may be helpful to rule out other problems. How is this treated? In some cases, no treatment is needed. The decision on whether treatment is necessary during the perimenopause should be made by you and your health care  provider based on how the symptoms are affecting you and your lifestyle. Various treatments are available, such as:  Treating individual symptoms with a specific medicine for that symptom.  Herbal medicines that can help specific symptoms.  Counseling.  Group therapy.  Follow these instructions at home:  Keep track of your menstrual periods (when they occur, how heavy they are, how long between periods, and how long they last) as well as your symptoms and when they started.  Only take over-the-counter or prescription medicines as directed by your health care provider.  Sleep and rest.  Exercise.  Eat a diet that contains calcium (good for your bones) and soy (acts like the estrogen hormone).  Do not smoke.  Avoid alcoholic beverages.  Take vitamin supplements as recommended by your health care provider. Taking vitamin E may help in certain cases.  Take calcium and vitamin D supplements to help prevent bone loss.  Group therapy is sometimes helpful.  Acupuncture may help in some cases. Contact a health care provider if:  You have questions about any symptoms you are having.  You need a referral to a specialist (gynecologist, psychiatrist, or psychologist). Get help right away if:  You have vaginal bleeding.  Your period lasts longer than 8 days.  Your periods are recurring sooner than 21 days.  You have bleeding after intercourse.  You have severe depression.  You have pain when you urinate.  You have severe headaches.  You have vision problems. This information is not intended to replace advice given to you by your health care provider. Make sure you discuss any questions you have with your health care provider. Document Released: 08/04/2004 Document Revised: 12/03/2015 Document Reviewed: 01/24/2013  Elsevier Interactive Patient Education  2017 [employer]. Menopause Menopause is the normal time of life when menstrual periods stop completely. Menopause is  complete when you have missed 12 consecutive menstrual periods. It usually occurs between the ages of 33 years and 42 years. Very rarely does a woman develop menopause before the age of 89 years. At menopause, your ovaries stop producing the female hormones estrogen and progesterone. This can cause undesirable symptoms and also affect your health. Sometimes the symptoms may occur 4-5 years before the menopause begins. There is no relationship between menopause and:  Oral contraceptives.  Number of children you had.  Race.  The age your menstrual periods started (menarche).  Heavy smokers and very thin women may develop menopause earlier in life. What are the causes?  The ovaries stop producing the female hormones estrogen and progesterone. Other causes include:  Surgery to remove both ovaries.  The ovaries stop functioning for no known reason.  Tumors of the pituitary gland in the brain.  Medical disease that affects the ovaries and hormone production.  Radiation treatment to the abdomen or pelvis.  Chemotherapy that affects the ovaries.  What are the signs or symptoms?  Hot flashes.  Night sweats.  Decrease in sex drive.  Vaginal dryness and thinning of the vagina causing painful intercourse.  Dryness of the skin and developing wrinkles.  Headaches.  Tiredness.  Irritability.  Memory problems.  Weight gain.  Bladder infections.  Hair growth of the face and chest.  Infertility. More serious symptoms include:  Loss of bone (osteoporosis) causing breaks (fractures).  Depression.  Hardening and narrowing of the arteries (atherosclerosis) causing heart attacks and strokes.  How is this diagnosed?  When the menstrual periods have stopped for 12 straight months.  Physical exam.  Hormone studies of the blood. How is this treated? There are many treatment choices and nearly as many questions about them. The decisions to treat or not to treat menopausal  changes is an individual choice made with your health care provider. Your health care provider can discuss the treatments with you. Together, you can decide which treatment will work best for you. Your treatment choices may include:  Hormone therapy (estrogen and progesterone).  Non-hormonal medicines.  Treating the individual symptoms with medicine (for example antidepressants for depression).  Herbal medicines that may help specific symptoms.  Counseling by a psychiatrist or psychologist.  Group therapy.  Lifestyle changes including: ? Eating healthy. ? Regular exercise. ? Limiting caffeine and alcohol. ? Stress management and meditation.  No treatment.  Follow these instructions at home:  Take the medicine your health care provider gives you as directed.  Get plenty of sleep and rest.  Exercise regularly.  Eat a diet that contains calcium (good for the bones) and soy products (acts like estrogen hormone).  Avoid alcoholic beverages.  Do not smoke.  If you have hot flashes, dress in layers.  Take supplements, calcium, and vitamin D to strengthen bones.  You can use over-the-counter lubricants or moisturizers for vaginal dryness.  Group therapy is sometimes very helpful.  Acupuncture may be helpful in some cases. Contact a health care provider if:  You are not sure you are in menopause.  You are having menopausal symptoms and need advice and treatment.  You are still having menstrual periods after age 52 years.  You have pain with intercourse.  Menopause is complete (no menstrual period for 12 months) and you develop vaginal bleeding.  You need a referral to a specialist (  gynecologist, psychiatrist, or psychologist) for treatment. Get help right away if:  You have severe depression.  You have excessive vaginal bleeding.  You fell and think you have a broken bone.  You have pain when you urinate.  You develop leg or chest pain.  You have a fast  pounding heart beat (palpitations).  You have severe headaches.  You develop vision problems.  You feel a lump in your breast.  You have abdominal pain or severe indigestion. This information is not intended to replace advice given to you by your health care provider. Make sure you discuss any questions you have with your health care provider. Document Released: 09/17/2003 Document Revised: 12/03/2015 Document Reviewed: 01/24/2013 Elsevier Interactive Patient Education  2017 [employer].

## 2018-01-18 NOTE — Progress Notes (Signed)
Subjective:    Patient ID: [name], female    DOB: 1966-06-16, 52 y.o.   MRN: [id number]  No chief complaint on file.   HPI Patient was seen today for acute concern.  Patient endorses not having menses since May 2019.  Pt endorses abdominal cramping and breast tenderness.  Pt states she took 5 home pregnancy test which were negative.  Pt also states she called her gynecologist last week and was informed she may be going through menopause.  Past Medical History:  Diagnosis Date  . Fibroids     Allergies  Allergen Reactions  . Penicillins Hives    ROS General: Denies fever, chills, night sweats, changes in weight, changes in appetite HEENT: Denies headaches, ear pain, changes in vision, rhinorrhea, sore throat CV: Denies CP, palpitations, SOB, orthopnea Pulm: Denies SOB, cough, wheezing GI: Denies abdominal pain, nausea, vomiting, diarrhea, constipation GU: Denies dysuria, hematuria, frequency, vaginal discharge   +absent menses Msk: Denies muscle cramps, joint pains Neuro: Denies weakness, numbness, tingling Skin: Denies rashes, bruising Psych: Denies depression, anxiety, hallucinations     Objective:    Blood pressure 132/88, pulse 70, temperature 98.3 F (36.8 C), temperature source Oral, weight 135 lb (61.2 kg), last menstrual period 11/13/2017, SpO2 98 %.   Gen. Pleasant, well-nourished, in no distress, normal affect   HEENT: [place]/AT, face symmetric, no scleral icterus, PERRLA,  nares patent without drainage Lungs: no accessory muscle use Cardiovascular: RRR, no peripheral edema Neuro:  A&Ox3, CN II-XII intact, normal gait   Wt Readings from Last 3 Encounters:  01/18/18 135 lb (61.2 kg)  10/02/17 135 lb (61.2 kg)  07/20/17 144 lb 6.4 oz (65.5 kg)    Lab Results  Component Value Date   WBC 6.3 10/02/2017   HGB 14.3 10/02/2017   HCT 42.6 10/02/2017   PLT 311.0 10/02/2017   GLUCOSE 94 10/02/2017   CHOL 126 10/02/2017   TRIG 82.0 10/02/2017   HDL 50.70  10/02/2017   LDLCALC 59 10/02/2017   NA 138 10/02/2017   K 4.2 10/02/2017   CL 104 10/02/2017   CREATININE 0.50 10/02/2017   BUN 11 10/02/2017   CO2 28 10/02/2017   TSH 2.77 10/02/2017   HGBA1C 5.6 10/02/2017    Assessment/Plan:  Perimenopause -Uhcg neg -agree with pt's OB/Gyn -Discussed s/s -given handout -advised has to be menses free x 1 yr to call it menopause.  If sees menses restart the countdown.  Missed periods  - Plan: POCT urine pregnancy  F/u prn  Grier Mitts, MD

## 2018-01-25 ENCOUNTER — Ambulatory Visit (INDEPENDENT_AMBULATORY_CARE_PROVIDER_SITE_OTHER): Payer: 59 | Admitting: Family Medicine

## 2018-01-25 ENCOUNTER — Encounter: Payer: Self-pay | Admitting: Family Medicine

## 2018-01-25 VITALS — BP 118/82 | HR 82 | Temp 98.1°F | Wt 137.0 lb

## 2018-01-25 DIAGNOSIS — J069 Acute upper respiratory infection, unspecified: Secondary | ICD-10-CM

## 2018-01-25 DIAGNOSIS — B9789 Other viral agents as the cause of diseases classified elsewhere: Secondary | ICD-10-CM

## 2018-01-25 DIAGNOSIS — R0982 Postnasal drip: Secondary | ICD-10-CM

## 2018-01-25 NOTE — Progress Notes (Signed)
Subjective:    Patient ID: [name], female    DOB: 12/02/1965, 52 y.o.   MRN: [id number]  No chief complaint on file.   HPI Patient was seen today for acute concern.  Patient endorses sneezing, congestion, sore throat, cough x4 days.  Pt states her sore throat has now improved after gargling with salt water.  Pt has tried Alka-Seltzer plus for her symptoms.  Pt endorses history of seasonal allergies but does not take medication.  Pt states she gets sick every yr this time.  Pt denies fever, facial pain or pressure, ear pain/pressure, headache, SOB, N/V.  Pt requesting note for work, out since 01/23/18.  Past Medical History:  Diagnosis Date  . Fibroids     Allergies  Allergen Reactions  . Penicillins Hives    ROS General: Denies fever, chills, night sweats, changes in weight, changes in appetite HEENT: Denies headaches, ear pain, changes in vision, rhinorrhea   +sneezing, sore throat, congestion CV: Denies CP, palpitations, SOB, orthopnea Pulm: Denies SOB, wheezing  + cough GI: Denies abdominal pain, nausea, vomiting, diarrhea, constipation GU: Denies dysuria, hematuria, frequency, vaginal discharge Msk: Denies muscle cramps, joint pains Neuro: Denies weakness, numbness, tingling Skin: Denies rashes, bruising Psych: Denies depression, anxiety, hallucinations     Objective:    Blood pressure 118/82, pulse 82, temperature 98.1 F (36.7 C), temperature source Oral, weight 137 lb (62.1 kg), SpO2 98 %.   Gen. Pleasant, well-nourished, in no distress, normal affect   HEENT: [place]/AT, face symmetric, no scleral icterus, PERRLA, nares patent with mild clear drainage, pharynx with post nasal drainage, no erythema or exudate. Lungs: no accessory muscle use, CTAB, no wheezes or rales Cardiovascular: RRR, no m/r/g, no peripheral edema Neuro:  A&Ox3, CN II-XII intact, normal gait Wt Readings from Last 3 Encounters:  01/25/18 137 lb (62.1 kg)  01/18/18 135 lb (61.2 kg)  10/02/17 135  lb (61.2 kg)    Lab Results  Component Value Date   WBC 6.3 10/02/2017   HGB 14.3 10/02/2017   HCT 42.6 10/02/2017   PLT 311.0 10/02/2017   GLUCOSE 94 10/02/2017   CHOL 126 10/02/2017   TRIG 82.0 10/02/2017   HDL 50.70 10/02/2017   LDLCALC 59 10/02/2017   NA 138 10/02/2017   K 4.2 10/02/2017   CL 104 10/02/2017   CREATININE 0.50 10/02/2017   BUN 11 10/02/2017   CO2 28 10/02/2017   TSH 2.77 10/02/2017   HGBA1C 5.6 10/02/2017    Assessment/Plan:  Post-nasal drainage -Discussed trying allergy medication such as Allegra, Zyrtec, Claritin -Discussed using nasal spray such as Flonase.  Viral URI with cough -supportive care. -continue gargling with warm salt water or Chloraseptic spray, okay to take Tylenol or ibuprofen for any pain/discomfort or fever, Delsym for cough -Given handout -Follow-up PRN  Note given for work  Grier Mitts, MD

## 2018-01-25 NOTE — Patient Instructions (Addendum)
You can take an allergy medicine such as Allegra, Claritin, Zyrtec, or the generic of these medicine for your symptoms.  You can also try a nasal spray such as Flonase or the generic for Flonase.  You can take Delsym cough syrup for your cough. Postnasal Drip Postnasal drip is the feeling of mucus going down the back of your throat. Mucus is a slimy substance that moistens and cleans your nose and throat, as well as the air pockets in face bones near your forehead and cheeks (sinuses). Small amounts of mucus pass from your nose and sinuses down the back of your throat all the time. This is normal. When you produce too much mucus or the mucus gets too thick, you can feel it. Some common causes of postnasal drip include:  Having more mucus because of: ? A cold or the flu. ? Allergies. ? Cold air. ? Certain medicines.  Having more mucus that is thicker because of: ? A sinus or nasal infection. ? Dry air. ? A food allergy.  Follow these instructions at home: Relieving discomfort  Gargle with a salt-water mixture 3-4 times a day or as needed. To make a salt-water mixture, completely dissolve -1 tsp of salt in 1 cup of warm water.  If the air in your home is dry, use a humidifier to add moisture to the air.  Use a saline spray or container (neti pot) to flush out the nose (nasal irrigation). These methods can help clear away mucus and keep the nasal passages moist. General instructions  Take over-the-counter and prescription medicines only as told by your health care provider.  Follow instructions from your health care provider about eating or drinking restrictions. You may need to avoid caffeine.  Avoid things that you know you are allergic to (allergens), like dust, mold, pollen, pets, or certain foods.  Drink enough fluid to keep your urine pale yellow.  Keep all follow-up visits as told by your health care provider. This is important. Contact a health care provider if:  You have a  fever.  You have a sore throat.  You have difficulty swallowing.  You have headache.  You have sinus pain.  You have a cough that does not go away.  The mucus from your nose becomes thick and is green or yellow in color.  You have cold or flu symptoms that last more than 10 days. Summary  Postnasal drip is the feeling of mucus going down the back of your throat.  If your health care provider approves, use nasal irrigation or a nasal spray 2?4 times a day.  Avoid things that you know you are allergic to (allergens), like dust, mold, pollen, pets, or certain foods. This information is not intended to replace advice given to you by your health care provider. Make sure you discuss any questions you have with your health care provider. Document Released: 10/10/2016 Document Revised: 10/10/2016 Document Reviewed: 10/10/2016 Elsevier Interactive Patient Education  2018 [employer].  Viral Respiratory Infection A viral respiratory infection is an illness that affects parts of the body used for breathing, like the lungs, nose, and throat. It is caused by a germ called a virus. Some examples of this kind of infection are:  A cold.  The flu (influenza).  A respiratory syncytial virus (RSV) infection.  How do I know if I have this infection? Most of the time this infection causes:  A stuffy or runny nose.  Yellow or green fluid in the nose.  A  cough.  Sneezing.  Tiredness (fatigue).  Achy muscles.  A sore throat.  Sweating or chills.  A fever.  A headache.  How is this infection treated? If the flu is diagnosed early, it may be treated with an antiviral medicine. This medicine shortens the length of time a person has symptoms. Symptoms may be treated with over-the-counter and prescription medicines, such as:  Expectorants. These make it easier to cough up mucus.  Decongestant nasal sprays.  Doctors do not prescribe antibiotic medicines for viral infections.  They do not work with this kind of infection. How do I know if I should stay home? To keep others from getting sick, stay home if you have:  A fever.  A lasting cough.  A sore throat.  A runny nose.  Sneezing.  Muscles aches.  Headaches.  Tiredness.  Weakness.  Chills.  Sweating.  An upset stomach (nausea).  Follow these instructions at home:  Rest as much as possible.  Take over-the-counter and prescription medicines only as told by your doctor.  Drink enough fluid to keep your pee (urine) clear or pale yellow.  Gargle with salt water. Do this 3-4 times per day or as needed. To make a salt-water mixture, dissolve -1 tsp of salt in 1 cup of warm water. Make sure the salt dissolves all the way.  Use nose drops made from salt water. This helps with stuffiness (congestion). It also helps soften the skin around your nose.  Do not drink alcohol.  Do not use tobacco products, including cigarettes, chewing tobacco, and e-cigarettes. If you need help quitting, ask your doctor. Get help if:  Your symptoms last for 10 days or longer.  Your symptoms get worse over time.  You have a fever.  You have very bad pain in your face or forehead.  Parts of your jaw or neck become very swollen. Get help right away if:  You feel pain or pressure in your chest.  You have shortness of breath.  You faint or feel like you will faint.  You keep throwing up (vomiting).  You feel confused. This information is not intended to replace advice given to you by your health care provider. Make sure you discuss any questions you have with your health care provider. Document Released: 06/09/2008 Document Revised: 12/03/2015 Document Reviewed: 12/03/2014 Elsevier Interactive Patient Education  2018 [employer].

## 2018-01-29 ENCOUNTER — Other Ambulatory Visit: Payer: 59

## 2018-03-02 ENCOUNTER — Ambulatory Visit: Payer: Self-pay | Admitting: Family Medicine

## 2018-03-02 NOTE — Progress Notes (Deleted)
No chief complaint on file.   HPI: [name] 52 y.o. come in for [facility] PCP NA     ? colonoscopy or screening? ROS: See pertinent positives and negatives per HPI.  Past Medical History:  Diagnosis Date  . Fibroids     Family History  Problem Relation Age of Onset  . Diabetes Mother   . Hypertension Mother   . Diabetes Father   . Hypertension Father     Social History   Socioeconomic History  . Marital status: Single    Spouse name: Not on file  . Number of children: Not on file  . Years of education: Not on file  . Highest education level: Not on file  Occupational History  . Not on file  Social Needs  . Financial resource strain: Not on file  . Food insecurity:    Worry: Not on file    Inability: Not on file  . Transportation needs:    Medical: Not on file    Non-medical: Not on file  Tobacco Use  . Smoking status: Former Research scientist (life sciences)  . Smokeless tobacco: Never Used  Substance and Sexual Activity  . Alcohol use: Yes    Comment: occass.   . Drug use: No  . Sexual activity: Yes  Lifestyle  . Physical activity:    Days per week: Not on file    Minutes per session: Not on file  . Stress: Not on file  Relationships  . Social connections:    Talks on phone: Not on file    Gets together: Not on file    Attends religious service: Not on file    Active member of club or organization: Not on file    Attends meetings of clubs or organizations: Not on file    Relationship status: Not on file  Other Topics Concern  . Not on file  Social History Narrative  . Not on file    No outpatient medications prior to visit.   No facility-administered medications prior to visit.      EXAM:  There were no vitals taken for this visit.  There is no height or weight on file to calculate BMI.  GENERAL: vitals reviewed and listed above, alert, oriented, appears well hydrated and in no acute distress HEENT: atraumatic, conjunctiva  clear, no obvious abnormalities on  inspection of external nose and ears OP : no lesion edema or exudate  NECK: no obvious masses on inspection palpation  LUNGS: clear to auscultation bilaterally, no wheezes, rales or rhonchi, good air movement CV: HRRR, no clubbing cyanosis or  peripheral edema nl cap refill  MS: moves all extremities without noticeable focal  abnormality PSYCH: pleasant and cooperative, no obvious depression or anxiety Lab Results  Component Value Date   WBC 6.3 10/02/2017   HGB 14.3 10/02/2017   HCT 42.6 10/02/2017   PLT 311.0 10/02/2017   GLUCOSE 94 10/02/2017   CHOL 126 10/02/2017   TRIG 82.0 10/02/2017   HDL 50.70 10/02/2017   LDLCALC 59 10/02/2017   NA 138 10/02/2017   K 4.2 10/02/2017   CL 104 10/02/2017   CREATININE 0.50 10/02/2017   BUN 11 10/02/2017   CO2 28 10/02/2017   TSH 2.77 10/02/2017   HGBA1C 5.6 10/02/2017   BP Readings from Last 3 Encounters:  01/25/18 118/82  01/18/18 132/88  10/02/17 108/70    ASSESSMENT AND PLAN:  Discussed the following assessment and plan:  No diagnosis found.  -Patient advised to return or  notify health care team  if  new concerns arise.  There are no Patient Instructions on file for this visit.   Standley Brooking. Kelee Cunningham M.D.

## 2018-03-02 NOTE — Telephone Encounter (Signed)
Rec'd phone call from pt.  Reported having small amt. bright red blood, on toilet tissue yesterday, after straining to have a BM.  Reported the stool was brown, formed.  Denied any blood in toilet. Stated she has had this happen occasionally, and feels it is very minor, and probably related to her hemorrhoids.  Denied abdominal pain, rectal pain, diarrhea, nausea or vomiting. Requested an appt.  Appt. given on 03/05/18 @ 2:15 PM with PCP office.  Care advice given per protocol.  Verb. Understanding.  Agrees with plan.     Reason for Disposition . Rectal bleeding is minimal (e.g., blood just on toilet paper, a few drops in toilet bowl)  Answer Assessment - Initial Assessment Questions 1. APPEARANCE of BLOOD: "What color is it?" "Is it passed separately, on the surface of the stool, or mixed in with the stool?"      Brown stool with small amt. Blood on toilet tissue 2. AMOUNT: "How much blood was passed?"     Small amt. On toilet tissue 3. FREQUENCY: "How many times has blood been passed with the stools?"      X 1, yesterday 4. ONSET: "When was the blood first seen in the stools?" (Days or weeks)      yesterday 5. DIARRHEA: "Is there also some diarrhea?" If so, ask: "How many diarrhea stools were passed in past 24 hours?"      denied 6. CONSTIPATION: "Do you have constipation?" If so, "How bad is it?"     Infreq. With straining 7. RECURRENT SYMPTOMS: "Have you had blood in your stools before?" If so, ask: "When was the last time?" and "What happened that time?"      Occas. episode; thinks this is related to her hemorrhoids 8. BLOOD THINNERS: "Do you take any blood thinners?" (e.g., Coumadin/warfarin, Pradaxa/dabigatran, aspirin)     Denied 9. OTHER SYMPTOMS: "Do you have any other symptoms?"  (e.g., abdominal pain, vomiting, dizziness, fever)     denied abd pain, rectal pain, nausea or vomiting.  10. PREGNANCY: "Is there any chance you are pregnant?" "When was your last menstrual period?"  Menopausal  Protocols used: RECTAL BLEEDING-A-AH

## 2018-03-05 ENCOUNTER — Ambulatory Visit: Payer: 59 | Admitting: Internal Medicine

## 2018-06-11 ENCOUNTER — Other Ambulatory Visit: Payer: Self-pay | Admitting: Obstetrics and Gynecology

## 2018-06-11 DIAGNOSIS — Z1231 Encounter for screening mammogram for malignant neoplasm of breast: Secondary | ICD-10-CM

## 2018-07-02 ENCOUNTER — Telehealth: Payer: Self-pay | Admitting: Family Medicine

## 2018-07-02 ENCOUNTER — Ambulatory Visit (INDEPENDENT_AMBULATORY_CARE_PROVIDER_SITE_OTHER): Payer: 59 | Admitting: Family Medicine

## 2018-07-02 ENCOUNTER — Encounter: Payer: Self-pay | Admitting: Family Medicine

## 2018-07-02 VITALS — BP 120/64 | HR 75 | Temp 97.9°F | Wt 146.8 lb

## 2018-07-02 DIAGNOSIS — M25562 Pain in left knee: Secondary | ICD-10-CM

## 2018-07-02 MED ORDER — NAPROXEN 500 MG PO TBEC: 500.0000 mg | DELAYED_RELEASE_TABLET | Freq: Two times a day (BID) | ORAL | 0 refills | Status: DC

## 2018-07-02 NOTE — Telephone Encounter (Signed)
Copied from [place] [id number]. Topic: Quick Communication - Rx Refill/Question >> Jul 02, 2018  2:03 PM Andria Frames L wrote: Medication: naproxen (NAPROXEN DR) 500 MG EC tablet [[id number]]  pt called and stated that her insurance does not cover and would like to know if generic could be called in. Please advise

## 2018-07-02 NOTE — Patient Instructions (Signed)

## 2018-07-02 NOTE — Progress Notes (Signed)
  [name] DOB: Jul 09, 1966 Encounter date: 07/02/2018  This is a 52 y.o. female who presents with Chief Complaint  Patient presents with  . Knee Pain    left knee, x 1 week, stands on cement floors at work, pain described as sooreness, pain located just under the knee, lateral side of the leg and back of the knee    History of present illness:  Left knee hurting medial and posteriorly. Worse in last week. No known injury -just a lot of walking at work. Hasn't hurt that knee before. Took ibuprofen which helped a little but then it started hurting again.    When she is working pain at worst is about 4/10.   Allergies  Allergen Reactions  . Penicillins Hives   No outpatient medications have been marked as taking for the 07/02/18 encounter (Office Visit) with Caren Macadam, MD.    Review of Systems  Constitutional: Negative for activity change and fever.    Objective:  BP 120/64 (BP Location: Left Arm, Patient Position: Sitting, Cuff Size: Normal)   Pulse 75   Temp 97.9 F (36.6 C) (Oral)   Wt 146 lb 12.8 oz (66.6 kg)   SpO2 95%   BMI 27.29 kg/m   Weight: 146 lb 12.8 oz (66.6 kg)   BP Readings from Last 3 Encounters:  07/02/18 120/64  01/25/18 118/82  01/18/18 132/88   Wt Readings from Last 3 Encounters:  07/02/18 146 lb 12.8 oz (66.6 kg)  01/25/18 137 lb (62.1 kg)  01/18/18 135 lb (61.2 kg)    Physical Exam Constitutional:      Appearance: Normal appearance.  Pulmonary:     Effort: Pulmonary effort is normal.  Musculoskeletal:     Comments: There is slight medial edema left knee. There is Med JLT to palpation. There is no instability appreciated of knee. There is posterior tenderness and small Baker's cyst appreciated. No pain with medial or lateral joint strain. Negative meniscal tenderness.   Neurological:     Mental Status: She is alert.     Assessment/Plan 1. Acute pain of left knee Ice after work; wear supportive shoes. Naproxen BID with  food x 2 weeks. Call with update in 2 weeks time if not improved.   Return if symptoms worsen or fail to improve in 2 weeks time.        Micheline Rough, MD

## 2018-07-02 NOTE — Telephone Encounter (Signed)
She can try over-the-counter naproxen 220 mg 2 tablets twice daily for now.   Thanks, BJ

## 2018-07-03 NOTE — Telephone Encounter (Signed)
Patient is aware 

## 2018-08-16 ENCOUNTER — Ambulatory Visit: Payer: 59 | Admitting: Family Medicine

## 2018-08-20 NOTE — Progress Notes (Signed)
Chief Complaint  Patient presents with  . Blister    on left pinky. pt states she noticed it friday and has been picking at it. Pt states it has gotten bigger since friday and that its sore.     HPI: [name] 53 y.o. come in for [place]  PCP NA  Right handed works in [place]   Noted tender red area base of pinky 4 days ago and   clisterd are over the weekend ssquuezed some out still red not as tender   ROS: See pertinent positives and negatives per HPI.  Past Medical History:  Diagnosis Date  . Fibroids     Family History  Problem Relation Age of Onset  . Diabetes Mother   . Hypertension Mother   . Diabetes Father   . Hypertension Father     Social History   Socioeconomic History  . Marital status: Single    Spouse name: Not on file  . Number of children: Not on file  . Years of education: Not on file  . Highest education level: Not on file  Occupational History  . Not on file  Social Needs  . Financial resource strain: Not on file  . Food insecurity:    Worry: Not on file    Inability: Not on file  . Transportation needs:    Medical: Not on file    Non-medical: Not on file  Tobacco Use  . Smoking status: Former Research scientist (life sciences)  . Smokeless tobacco: Never Used  Substance and Sexual Activity  . Alcohol use: Yes    Comment: occass.   . Drug use: No  . Sexual activity: Yes  Lifestyle  . Physical activity:    Days per week: Not on file    Minutes per session: Not on file  . Stress: Not on file  Relationships  . Social connections:    Talks on phone: Not on file    Gets together: Not on file    Attends religious service: Not on file    Active member of club or organization: Not on file    Attends meetings of clubs or organizations: Not on file    Relationship status: Not on file  Other Topics Concern  . Not on file  Social History Narrative  . Not on file    Outpatient Medications Prior to Visit  Medication Sig Dispense Refill  . naproxen  (NAPROXEN DR) 500 MG EC tablet Take 1 tablet (500 mg total) by mouth 2 (two) times daily with a meal. 60 tablet 0   No facility-administered medications prior to visit.      EXAM:  BP 120/82 (BP Location: Right Arm, Patient Position: Sitting, Cuff Size: Normal)   Pulse 76   Temp 98.7 F (37.1 C) (Oral)   Wt 152 lb 9.6 oz (69.2 kg)   LMP 06/13/2018   BMI 28.37 kg/m   Body mass index is 28.37 kg/m.  GENERAL: vitals reviewed and listed above, alert, oriented, appears well hydrated and in no acute distress Left pinky with paronychia at the base    With 2+ redness ans swelling.  After soaking   Used 20 gauge needle to  elevate eponychia and aspirate  Area  and  Expressed  Yellow to serous pus dc  Tolerated well   .  And much less swelling  Covered with band aid   BP Readings from Last 3 Encounters:  08/21/18 120/82  07/02/18 120/64  01/25/18 118/82    ASSESSMENT  AND PLAN:  Discussed the following assessment and plan:  Finger infection  Paronychia of left little finger Cont soaking and add short course antibiotic and protected continue use gloves  -Patient advised to return or notify health care team  if  new concerns arise.  Patient Instructions  Soak In warm soapy water or epsoms for 5-10 minutes   3 x per day or more until better   This will help it drain if needed  Keep covered  When working. Antibiotic for  3-5 days     Paronychia Paronychia is an infection of the skin that surrounds a nail. It usually affects the skin around a fingernail, but it may also occur near a toenail. It often causes pain and swelling around the nail. In some cases, a collection of pus (abscess) can form near or under the nail.  This condition may develop suddenly, or it may develop gradually over a longer period. In most cases, paronychia is not serious, and it will clear up with treatment. What are the causes? This condition may be caused by bacteria or a fungus. These germs can enter the  body through an opening in the skin, such as a cut or a hangnail. What increases the risk? This condition is more likely to develop in people who:  Get their hands wet often, such as those who work as Designer, industrial/product, bartenders, or nurses.  Bite their fingernails or suck their thumbs.  Trim their nails very short.  Have hangnails or injured fingertips.  Get manicures.  Have diabetes. What are the signs or symptoms? Symptoms of this condition include:  Redness and swelling of the skin near the nail.  Tenderness around the nail when you touch the area.  Pus-filled bumps under the skin at the base and sides of the nail (cuticle).  Fluid or pus under the nail.  Throbbing pain in the area. How is this diagnosed? This condition is diagnosed with a physical exam. In some cases, a sample of pus may be tested to determine what type of bacteria or fungus is causing the condition. How is this treated? Treatment depends on the cause and severity of your condition. If your condition is mild, it may clear up on its own in a few days or after soaking in warm water. If needed, treatment may include:  Antibiotic medicine, if your infection is caused by bacteria.  Antifungal medicine, if your infection is caused by a fungus.  A procedure to drain pus from an abscess.  Anti-inflammatory medicine (corticosteroids). Follow these instructions at home: Wound care  Keep the affected area clean.  Soak the affected area in warm water, if told to do so by your health care provider. You may be told to do this for 20 minutes, 2-3 times a day.  Keep the area dry when you are not soaking it.  Do not try to drain an abscess yourself.  Follow instructions from your health care provider about how to take care of the affected area. Make sure you: ? Wash your hands with soap and water before you change your bandage (dressing). If soap and water are not available, use hand sanitizer. ? Change your  dressing as told by your health care provider.  If you had an abscess drained, check the area every day for signs of infection. Check for: ? Redness, swelling, or pain. ? Fluid or blood. ? Warmth. ? Pus or a bad smell. Medicines   Take over-the-counter and prescription medicines only as told  by your health care provider.  If you were prescribed an antibiotic medicine, take it as told by your health care provider. Do not stop taking the antibiotic even if you start to feel better. General instructions  Avoid contact with harsh chemicals.  Do not pick at the affected area. Prevention  To prevent this condition from happening again: ? Wear rubber gloves when washing dishes or doing other tasks that require your hands to get wet. ? Wear gloves if your hands might come in contact with cleaners or other chemicals. ? Avoid injuring your nails or fingertips. ? Do not bite your nails or tear hangnails. ? Do not cut your nails very short. ? Do not cut your cuticles. ? Use clean nail clippers or scissors when trimming nails. Contact a health care provider if:  Your symptoms get worse or do not improve with treatment.  You have continued or increased fluid, blood, or pus coming from the affected area.  Your finger or knuckle becomes swollen or difficult to move. Get help right away if you have:  A fever or chills.  Redness spreading away from the affected area.  Joint or muscle pain. Summary  Paronychia is an infection of the skin that surrounds a nail. It often causes pain and swelling around the nail. In some cases, a collection of pus (abscess) can form near or under the nail.  This condition may be caused by bacteria or a fungus. These germs can enter the body through an opening in the skin, such as a cut or a hangnail.  If your condition is mild, it may clear up on its own in a few days. If needed, treatment may include medicine or a procedure to drain pus from an  abscess.  To prevent this condition from happening again, wear gloves if doing tasks that require your hands to get wet or to come in contact with chemicals. Also avoid injuring your nails or fingertips. This information is not intended to replace advice given to you by your health care provider. Make sure you discuss any questions you have with your health care provider. Document Released: 12/21/2000 Document Revised: 07/10/2017 Document Reviewed: 07/10/2017 Elsevier Interactive Patient Education  2019 [employer]. Shaterica Mcclatchy M.D.

## 2018-08-21 ENCOUNTER — Ambulatory Visit (INDEPENDENT_AMBULATORY_CARE_PROVIDER_SITE_OTHER): Payer: 59 | Admitting: Internal Medicine

## 2018-08-21 ENCOUNTER — Encounter: Payer: Self-pay | Admitting: Internal Medicine

## 2018-08-21 ENCOUNTER — Ambulatory Visit: Payer: Commercial Managed Care - PPO | Admitting: Family Medicine

## 2018-08-21 VITALS — BP 120/82 | HR 76 | Temp 98.7°F | Wt 152.6 lb

## 2018-08-21 DIAGNOSIS — L03012 Cellulitis of left finger: Secondary | ICD-10-CM

## 2018-08-21 DIAGNOSIS — L089 Local infection of the skin and subcutaneous tissue, unspecified: Secondary | ICD-10-CM | POA: Diagnosis not present

## 2018-08-21 MED ORDER — DOXYCYCLINE HYCLATE 100 MG PO TABS: 100.0000 mg | ORAL_TABLET | Freq: Two times a day (BID) | ORAL | 0 refills | Status: DC

## 2018-08-21 NOTE — Patient Instructions (Addendum)
Soak In warm soapy water or epsoms for 5-10 minutes   3 x per day or more until better   This will help it drain if needed  Keep covered  When working. Antibiotic for  3-5 days     Paronychia Paronychia is an infection of the skin that surrounds a nail. It usually affects the skin around a fingernail, but it may also occur near a toenail. It often causes pain and swelling around the nail. In some cases, a collection of pus (abscess) can form near or under the nail.  This condition may develop suddenly, or it may develop gradually over a longer period. In most cases, paronychia is not serious, and it will clear up with treatment. What are the causes? This condition may be caused by bacteria or a fungus. These germs can enter the body through an opening in the skin, such as a cut or a hangnail. What increases the risk? This condition is more likely to develop in people who:  Get their hands wet often, such as those who work as Designer, industrial/product, bartenders, or nurses.  Bite their fingernails or suck their thumbs.  Trim their nails very short.  Have hangnails or injured fingertips.  Get manicures.  Have diabetes. What are the signs or symptoms? Symptoms of this condition include:  Redness and swelling of the skin near the nail.  Tenderness around the nail when you touch the area.  Pus-filled bumps under the skin at the base and sides of the nail (cuticle).  Fluid or pus under the nail.  Throbbing pain in the area. How is this diagnosed? This condition is diagnosed with a physical exam. In some cases, a sample of pus may be tested to determine what type of bacteria or fungus is causing the condition. How is this treated? Treatment depends on the cause and severity of your condition. If your condition is mild, it may clear up on its own in a few days or after soaking in warm water. If needed, treatment may include:  Antibiotic medicine, if your infection is caused by  bacteria.  Antifungal medicine, if your infection is caused by a fungus.  A procedure to drain pus from an abscess.  Anti-inflammatory medicine (corticosteroids). Follow these instructions at home: Wound care  Keep the affected area clean.  Soak the affected area in warm water, if told to do so by your health care provider. You may be told to do this for 20 minutes, 2-3 times a day.  Keep the area dry when you are not soaking it.  Do not try to drain an abscess yourself.  Follow instructions from your health care provider about how to take care of the affected area. Make sure you: ? Wash your hands with soap and water before you change your bandage (dressing). If soap and water are not available, use hand sanitizer. ? Change your dressing as told by your health care provider.  If you had an abscess drained, check the area every day for signs of infection. Check for: ? Redness, swelling, or pain. ? Fluid or blood. ? Warmth. ? Pus or a bad smell. Medicines   Take over-the-counter and prescription medicines only as told by your health care provider.  If you were prescribed an antibiotic medicine, take it as told by your health care provider. Do not stop taking the antibiotic even if you start to feel better. General instructions  Avoid contact with harsh chemicals.  Do not pick at the affected area.  Prevention  To prevent this condition from happening again: ? Wear rubber gloves when washing dishes or doing other tasks that require your hands to get wet. ? Wear gloves if your hands might come in contact with cleaners or other chemicals. ? Avoid injuring your nails or fingertips. ? Do not bite your nails or tear hangnails. ? Do not cut your nails very short. ? Do not cut your cuticles. ? Use clean nail clippers or scissors when trimming nails. Contact a health care provider if:  Your symptoms get worse or do not improve with treatment.  You have continued or increased  fluid, blood, or pus coming from the affected area.  Your finger or knuckle becomes swollen or difficult to move. Get help right away if you have:  A fever or chills.  Redness spreading away from the affected area.  Joint or muscle pain. Summary  Paronychia is an infection of the skin that surrounds a nail. It often causes pain and swelling around the nail. In some cases, a collection of pus (abscess) can form near or under the nail.  This condition may be caused by bacteria or a fungus. These germs can enter the body through an opening in the skin, such as a cut or a hangnail.  If your condition is mild, it may clear up on its own in a few days. If needed, treatment may include medicine or a procedure to drain pus from an abscess.  To prevent this condition from happening again, wear gloves if doing tasks that require your hands to get wet or to come in contact with chemicals. Also avoid injuring your nails or fingertips. This information is not intended to replace advice given to you by your health care provider. Make sure you discuss any questions you have with your health care provider. Document Released: 12/21/2000 Document Revised: 07/10/2017 Document Reviewed: 07/10/2017 Elsevier Interactive Patient Education  2019 [employer].

## 2018-09-24 ENCOUNTER — Encounter: Payer: 59 | Admitting: Family Medicine

## 2018-09-24 ENCOUNTER — Ambulatory Visit: Payer: 59 | Admitting: Family Medicine

## 2018-10-01 ENCOUNTER — Ambulatory Visit: Payer: 59 | Admitting: Family Medicine

## 2018-10-01 ENCOUNTER — Ambulatory Visit: Payer: Self-pay

## 2018-10-01 NOTE — Telephone Encounter (Signed)
Pt calling to reschedule appointment as directed By office. Appointment scheduled.

## 2018-10-26 ENCOUNTER — Ambulatory Visit: Payer: 59 | Admitting: Family Medicine

## 2018-10-29 ENCOUNTER — Ambulatory Visit: Payer: 59

## 2018-10-31 ENCOUNTER — Ambulatory Visit: Payer: 59 | Admitting: Family Medicine

## 2018-11-08 ENCOUNTER — Ambulatory Visit: Payer: 59 | Admitting: Family Medicine

## 2018-11-15 ENCOUNTER — Ambulatory Visit: Payer: 59 | Admitting: Family Medicine

## 2018-11-23 ENCOUNTER — Ambulatory Visit: Payer: 59 | Admitting: Family Medicine

## 2018-11-30 ENCOUNTER — Ambulatory Visit (INDEPENDENT_AMBULATORY_CARE_PROVIDER_SITE_OTHER): Payer: 59 | Admitting: Family Medicine

## 2018-11-30 ENCOUNTER — Encounter: Payer: Self-pay | Admitting: Family Medicine

## 2018-11-30 ENCOUNTER — Other Ambulatory Visit: Payer: Self-pay

## 2018-11-30 DIAGNOSIS — R635 Abnormal weight gain: Secondary | ICD-10-CM

## 2018-11-30 DIAGNOSIS — Z131 Encounter for screening for diabetes mellitus: Secondary | ICD-10-CM

## 2018-11-30 DIAGNOSIS — Z13220 Encounter for screening for lipoid disorders: Secondary | ICD-10-CM | POA: Diagnosis not present

## 2018-11-30 DIAGNOSIS — Z1329 Encounter for screening for other suspected endocrine disorder: Secondary | ICD-10-CM | POA: Diagnosis not present

## 2018-11-30 NOTE — Progress Notes (Signed)
Virtual Visit via Telephone Note  I connected with [name] on 11/30/18 at 10:00 AM EDT by telephone and verified that I am speaking with the correct person using two identifiers.   I discussed the limitations, risks, security and privacy concerns of performing an evaluation and management service by telephone and the availability of in person appointments. I also discussed with the patient that there may be a patient responsible charge related to this service. The patient expressed understanding and agreed to proceed.  Location patient: home Location provider: work or home office Participants present for the call: patient, provider Patient did not have a visit in the prior 7 days to address this/these issue(s).   History of Present Illness: Pt is doing well overall, states she wanted to have labs done to check cholesterol, A1C, and thyroid.  States does not necessarily want to come in for a physical.  Has pap done with OB/Gyn.    Pt endorses weight gain since COVID-19 pandemicstarted as she is eating more fast food.  Inquires about weight loss meds.  Pt would also like her bp and weight checked.   Observations/Objective: Patient sounds cheerful and well on the phone. I do not appreciate any SOB. Speech and thought processing are grossly intact. Patient reported vitals:  Assessment and Plan: Weight gain  -discussed eating on a regular schedule, increasing physical activity, portions and other ways to manage weight -advised against wt loss med a this time.  Consider wt management clinic. - Plan: TSH, Lipid panel, Hemoglobin A1c  Screening for diabetes mellitus  - Plan: Hemoglobin A1c  Screening for cholesterol level  - Plan: Lipid panel  Screening for thyroid disorder  - Plan: TSH    Follow Up Instructions: F/u next wk for labs.  Orders placed.    I did not refer this patient for an OV in the next 24 hours for this/these issue(s).  I discussed the assessment and  treatment plan with the patient. The patient was provided an opportunity to ask questions and all were answered. The patient agreed with the plan and demonstrated an understanding of the instructions.   The patient was advised to call back or seek an in-person evaluation if the symptoms worsen or if the condition fails to improve as anticipated.  I provided 11 minutes of non-face-to-face time during this encounter.   Angel Ruddy, MD

## 2018-12-06 ENCOUNTER — Other Ambulatory Visit: Payer: Self-pay

## 2018-12-06 ENCOUNTER — Other Ambulatory Visit (INDEPENDENT_AMBULATORY_CARE_PROVIDER_SITE_OTHER): Payer: 59

## 2018-12-06 DIAGNOSIS — Z1329 Encounter for screening for other suspected endocrine disorder: Secondary | ICD-10-CM | POA: Diagnosis not present

## 2018-12-06 DIAGNOSIS — Z131 Encounter for screening for diabetes mellitus: Secondary | ICD-10-CM

## 2018-12-06 DIAGNOSIS — R635 Abnormal weight gain: Secondary | ICD-10-CM

## 2018-12-06 DIAGNOSIS — Z13220 Encounter for screening for lipoid disorders: Secondary | ICD-10-CM

## 2018-12-06 LAB — LIPID PANEL
Cholesterol: 142 mg/dL (ref 0–200)
HDL: 52.3 mg/dL (ref >39.00)
LDL Cholesterol: 77 mg/dL (ref 0–99)
NonHDL: 90.11
Total CHOL/HDL Ratio: 3
Triglycerides: 67 mg/dL (ref 0.0–149.0)
VLDL: 13.4 mg/dL (ref 0.0–40.0)

## 2018-12-06 LAB — HEMOGLOBIN A1C: Hgb A1c MFr Bld: 6 % (ref 4.6–6.5)

## 2018-12-06 LAB — TSH: TSH: 2.55 u[IU]/mL (ref 0.35–4.50)

## 2018-12-07 ENCOUNTER — Telehealth: Payer: Self-pay | Admitting: Family Medicine

## 2018-12-07 DIAGNOSIS — Z124 Encounter for screening for malignant neoplasm of cervix: Secondary | ICD-10-CM | POA: Diagnosis not present

## 2018-12-07 DIAGNOSIS — R87610 Atypical squamous cells of undetermined significance on cytologic smear of cervix (ASC-US): Secondary | ICD-10-CM | POA: Diagnosis not present

## 2018-12-07 DIAGNOSIS — Z01419 Encounter for gynecological examination (general) (routine) without abnormal findings: Secondary | ICD-10-CM | POA: Diagnosis not present

## 2018-12-07 LAB — HM PAP SMEAR

## 2018-12-07 NOTE — Telephone Encounter (Signed)
Copied from [place] [id number]. Topic: Quick Communication - See Telephone Encounter >> Dec 07, 2018  6:14 PM Blase Mess A wrote: CRM for notification. See Telephone encounter for: 12/07/18.  Patient is calling back regarding her lab work. Please advise Cb- [phone]

## 2018-12-10 NOTE — Telephone Encounter (Signed)
Pt was given lab results and a copy of the results was mailed out as requested

## 2018-12-11 ENCOUNTER — Ambulatory Visit
Admission: RE | Admit: 2018-12-11 | Discharge: 2018-12-11 | Disposition: A | Discharge status: Home / Self Care | Payer: 59 | Source: Ambulatory Visit | Attending: Obstetrics and Gynecology | Admitting: Obstetrics and Gynecology

## 2018-12-11 ENCOUNTER — Other Ambulatory Visit: Payer: Self-pay

## 2018-12-11 DIAGNOSIS — Z1231 Encounter for screening mammogram for malignant neoplasm of breast: Secondary | ICD-10-CM

## 2019-06-14 DIAGNOSIS — H5213 Myopia, bilateral: Secondary | ICD-10-CM | POA: Diagnosis not present

## 2019-06-24 ENCOUNTER — Other Ambulatory Visit: Payer: Self-pay

## 2019-06-25 ENCOUNTER — Ambulatory Visit (INDEPENDENT_AMBULATORY_CARE_PROVIDER_SITE_OTHER): Payer: 59 | Admitting: Family Medicine

## 2019-06-25 ENCOUNTER — Encounter: Payer: Self-pay | Admitting: Family Medicine

## 2019-06-25 VITALS — BP 140/90 | HR 81 | Temp 96.2°F | Resp 12 | Ht 61.5 in | Wt 158.0 lb

## 2019-06-25 DIAGNOSIS — R030 Elevated blood-pressure reading, without diagnosis of hypertension: Secondary | ICD-10-CM

## 2019-06-25 DIAGNOSIS — M549 Dorsalgia, unspecified: Secondary | ICD-10-CM | POA: Diagnosis not present

## 2019-06-25 MED ORDER — NAPROXEN 500 MG PO TABS: 500.0000 mg | ORAL_TABLET | Freq: Two times a day (BID) | ORAL | 0 refills | Status: AC

## 2019-06-25 MED ORDER — TIZANIDINE HCL 4 MG PO TABS: 4.0000 mg | ORAL_TABLET | Freq: Four times a day (QID) | ORAL | 0 refills | Status: AC | PRN

## 2019-06-25 NOTE — Progress Notes (Signed)
ACUTE VISIT   HPI:  Chief Complaint  Patient presents with  . Back Pain    started about friday/saturday. not painful, pt is able to walk and move normally. feels like a pull, may have slept on it wrong.    [name] is a 53 y.o. female, who is here today complaining of 3-4 days of intermittent left-sided back pain as described above. She is reporting this problem is new. Affected area: Lateral to left scapula,thoracic,lumbar , and left sacral pain. Pulling/dull pressure like pain, 3/10.  She denies any recent injury but she has done more frequent lifting.  Pain is not radiated and no associated LE numbness, tingling, urinary incontinence or retention, stool incontinence, or saddle anesthesia.  Exacerbated by left shoulder full abduction/strecthing affected area. No limitation of ROM. Alleviated by rest. No rash or edema on area, fever, chills, or abnormal wt loss. Negative for cough, dyspnea, or wheezing. Prior Hx of back pain: Denies   OTC medications: Aleve x2. Problem is stable.   BP mildly elevated today, denies history of hypertension. Negative for severe/frequent headache, visual changes, chest pain, palpitation,or edema.  Review of Systems  Constitutional: Negative for activity change, appetite change and fatigue.  HENT: Negative for mouth sores, nosebleeds and sore throat.   Gastrointestinal: Negative for abdominal pain, nausea and vomiting.       Negative for changes in bowel habits.  Genitourinary: Negative for decreased urine volume, dysuria and hematuria.  Musculoskeletal: Negative for gait problem.  Neurological: Negative for syncope and weakness.  Rest see pertinent positives and negatives per HPI.  No current outpatient medications on file prior to visit.   No current facility-administered medications on file prior to visit.   Past Medical History:  Diagnosis Date  . Fibroids    Allergies  Allergen Reactions  . Penicillins Hives      Social History   Socioeconomic History  . Marital status: Single    Spouse name: Not on file  . Number of children: Not on file  . Years of education: Not on file  . Highest education level: Not on file  Occupational History  . Not on file  Tobacco Use  . Smoking status: Former Research scientist (life sciences)  . Smokeless tobacco: Never Used  Substance and Sexual Activity  . Alcohol use: Yes    Comment: occass.   . Drug use: No  . Sexual activity: Yes  Other Topics Concern  . Not on file  Social History Narrative  . Not on file   Social Determinants of Health   Financial Resource Strain:   . Difficulty of Paying Living Expenses: Not on file  Food Insecurity:   . Worried About Charity fundraiser in the Last Year: Not on file  . Ran Out of Food in the Last Year: Not on file  Transportation Needs:   . Lack of Transportation (Medical): Not on file  . Lack of Transportation (Non-Medical): Not on file  Physical Activity:   . Days of Exercise per Week: Not on file  . Minutes of Exercise per Session: Not on file  Stress:   . Feeling of Stress : Not on file  Social Connections:   . Frequency of Communication with Friends and Family: Not on file  . Frequency of Social Gatherings with Friends and Family: Not on file  . Attends Religious Services: Not on file  . Active Member of Clubs or Organizations: Not on file  . Attends Archivist  Meetings: Not on file  . Marital Status: Not on file    Vitals:   06/25/19 1014  BP: 140/90  Pulse: 81  Resp: 12  Temp: (!) 96.2 F (35.7 C)  SpO2: 97%   Body mass index is 29.37 kg/m.  Physical Exam  Nursing note and vitals reviewed. Constitutional: She is oriented to person, place, and time. She appears well-developed. She does not appear ill. No distress.  HENT:  Head: Normocephalic and atraumatic.  Eyes: Conjunctivae are normal.  Cardiovascular: Normal rate.  Respiratory: Effort normal and breath sounds normal. No respiratory distress.   GI: Soft. She exhibits no mass. There is no hepatomegaly. There is no abdominal tenderness.  Musculoskeletal:        General: No edema.     Left shoulder: No tenderness or bony tenderness. Normal range of motion.     Cervical back: No tenderness or bony tenderness. Normal range of motion.     Thoracic back: Tenderness present. No bony tenderness.     Lumbar back: Spasms present. No tenderness or bony tenderness. Normal range of motion.       Back:     Left hip: No tenderness. Normal range of motion. Normal strength.     Comments: No significant deformity appreciated. No local edema or erythema appreciated, no suspicious lesions.  Lymphadenopathy:    She has no cervical adenopathy.       Right: No supraclavicular adenopathy present.       Left: No supraclavicular adenopathy present.  Neurological: She is alert and oriented to person, place, and time. She has normal strength. No cranial nerve deficit. Coordination and gait normal.  SLR negative bilateral.  Skin: Skin is warm. No rash noted. No erythema.  Psychiatric: She has a normal mood and affect.  Well groomed, good eye contact.   ASSESSMENT AND PLAN:  Ms.[name] was seen today for back pain.  Diagnoses and all orders for this visit:  Acute left-sided back pain, unspecified back location New problem. History and physical exam today do not suggest a serious problem. I do not think imaging is needed at this time, she agrees. Continue with stretching exercises. Topical IcyHot or Aspercreme may help. We discussed some side effects of muscle relaxant. Naproxen to be taken with food. She can take OTC acetaminophen 500 mg 3 times daily as needed. Monitor for warning signs. If pain is persistent in 4 to 6 weeks or if it gets worse, she was instructed to follow-up with PCP.  -     tiZANidine (ZANAFLEX) 4 MG tablet; Take 1 tablet (4 mg total) by mouth every 6 (six) hours as needed for up to 15 days for muscle spasms. -     naproxen  (NAPROSYN) 500 MG tablet; Take 1 tablet (500 mg total) by mouth 2 (two) times daily with a meal for 5 days.  Elevated blood pressure reading Mild. Recommend monitoring BP at home. Continue following with PCP.   Return if symptoms worsen or fail to improve.    [name] G. Martinique, MD  [facility]. [place] office.

## 2019-06-25 NOTE — Patient Instructions (Addendum)
A few things to remember from today's visit:   Acute left-sided back pain, unspecified back location - Plan: tiZANidine (ZANAFLEX) 4 MG tablet, naproxen (NAPROSYN) 500 MG tablet  Pain seems to be muscle related. Stretching exercises may help. Topical asper cream or Icy hot. Tylenol 500 mg 3 times per day as needed.  Monitor blood pressure at home and take Naproxen with food.  Please be sure medication list is accurate. If a new problem present, please set up appointment sooner than planned today.

## 2019-08-19 ENCOUNTER — Other Ambulatory Visit: Payer: Self-pay | Admitting: Obstetrics and Gynecology

## 2019-08-19 DIAGNOSIS — Z1231 Encounter for screening mammogram for malignant neoplasm of breast: Secondary | ICD-10-CM

## 2019-08-26 ENCOUNTER — Encounter: Payer: Self-pay | Admitting: Family Medicine

## 2019-08-26 ENCOUNTER — Other Ambulatory Visit: Payer: Self-pay

## 2019-08-26 ENCOUNTER — Ambulatory Visit (INDEPENDENT_AMBULATORY_CARE_PROVIDER_SITE_OTHER): Payer: 59 | Admitting: Family Medicine

## 2019-08-26 VITALS — BP 126/80 | HR 76 | Temp 96.2°F | Resp 12 | Ht 61.5 in | Wt 162.0 lb

## 2019-08-26 DIAGNOSIS — M25561 Pain in right knee: Secondary | ICD-10-CM

## 2019-08-26 DIAGNOSIS — M545 Low back pain, unspecified: Secondary | ICD-10-CM

## 2019-08-26 DIAGNOSIS — M25531 Pain in right wrist: Secondary | ICD-10-CM

## 2019-08-26 MED ORDER — TIZANIDINE HCL 4 MG PO TABS: 2.0000 mg | ORAL_TABLET | Freq: Two times a day (BID) | ORAL | 0 refills | Status: AC | PRN

## 2019-08-26 MED ORDER — CELECOXIB 100 MG PO CAPS: 100.0000 mg | ORAL_CAPSULE | Freq: Two times a day (BID) | ORAL | 0 refills | Status: AC

## 2019-08-26 NOTE — Patient Instructions (Addendum)
A few things to remember from today's visit:   Acute pain of right knee - Plan: DG Knee Complete 4 Views Right, celecoxib (CELEBREX) 100 MG capsule  Right wrist pain - Plan: celecoxib (CELEBREX) 100 MG capsule  Right low back pain, unspecified chronicity, unspecified whether sciatica present - Plan: tiZANidine (ZANAFLEX) 4 MG tablet   Tenosynovitis  Tenosynovitis is inflammation of a tendon and of the sleeve of tissue that covers the tendon (tendon sheath). A tendon is a cord of tissue that connects muscle to bone. Normally, a tendon slides smoothly inside its tendon sheath. Tenosynovitis limits movement of the tendon and surrounding tissues, which may cause pain and stiffness. Tenosynovitis can affect any tendon and tendon sheath. Commonly affected areas include tendons in the:  Wrist.  Arm.  Hand.  Hip.  Leg.  Foot.  Shoulder. What are the causes? The main cause of this condition is wear and tear over time that results in slight tears in the tendon. Other possible causes include:  An injury to the tendon or tendon sheath.  A disease that causes inflammation in the body.  An infection that spreads to the tendon and tendon sheath from a skin wound.  An infection in another part of the body that spreads to the tendon and tendon sheath through the blood. What increases the risk? The following factors may make you more likely to develop this condition:  Having rheumatoid arthritis, gout, or diabetes.  Using IV drugs.  Doing physical activities that can cause tendon overuse and stress.  Having gonorrhea. What are the signs or symptoms? Symptoms of this condition depend on the cause. Symptoms may include:  Pain with movement.  Pain when pressing on the tendon and tendon sheath.  Swelling.  Stiffness. If tenosynovitis is caused by an infection, symptoms may include:  Fever.  Redness.  Warmth. How is this diagnosed? This condition may be diagnosed based on  your medical history and a physical exam. You also may have:  Blood tests.  Imaging tests, such as: ? MRI. ? Ultrasound.  A sample of fluid removed from inside the tendon sheath to be checked in a lab. How is this treated? Treatment for this condition depends on the cause. If tenosynovitis is not caused by an infection, treatment may include:  Rest.  Keeping the tendon in place (immobilization) in a splint, brace, or sling.  Taking NSAIDs to reduce pain and swelling.  A shot (injection) of medicine to help reduce pain and swelling (steroid).  Icing or applying heat to the affected area.  Physical therapy.  Surgery to release the tendon in the sheath or to repair damage to the tendon or tendon sheath. Surgery may be done if other treatments do not help relieve symptoms. If tenosynovitis is caused by infection, treatment may include antibiotic medicine given through an IV. In some cases, surgery may be needed to drain fluid from the tendon sheath or to remove the tendon sheath. Follow these instructions at home: If you have a splint, brace, or sling:   Wear the splint, brace, or sling as told by your health care provider. Remove it only as told by your health care provider.  Loosen the splint, brace, or sling if your fingers or toes tingle, become numb, or turn cold and blue.  Keep the splint, brace, or sling clean.  If the splint, brace, or sling is not waterproof: ? Do not let it get wet. ? Cover it with a watertight covering when you take a  bath or shower. Managing pain, stiffness, and swelling   If directed, put ice on the affected area. ? Put ice in a plastic bag. ? Place a towel between your skin and the bag. ? Leave the ice on for 20 minutes, 2-3 times a day.  Move the fingers or toes of the affected limb often, if this applies. This can help to reduce stiffness and swelling.  If directed, raise (elevate) the affected area above the level of your heart while you  are sitting or lying down.  If directed, apply heat to the affected area before you exercise. Use the heat source that your health care provider recommends, such as a moist heat pack or a heating pad. ? Place a towel between your skin and the heat source. ? Leave the heat on for 20-30 minutes. ? Remove the heat if your skin turns bright red. This is especially important if you are unable to feel pain, heat, or cold. You may have a greater risk of getting burned. Medicines  Take over-the-counter and prescription medicines only as told by your health care provider.  Ask your health care provider if the medicine prescribed to you: ? Requires you to avoid driving or using heavy machinery. ? Can cause constipation. You may need to take actions to prevent or treat constipation, such as:  Drink enough fluid to keep your urine pale yellow.  Take over-the-counter or prescription medicines.  Eat foods that are high in fiber, such as beans, whole grains, and fresh fruits and vegetables.  Limit foods that are high in fat and processed sugars, such as fried or sweet foods. Activity  Return to your normal activities as told by your health care provider. Ask your health care provider what activities are safe for you.  Rest the affected area as told by your health care provider.  Avoid using the affected area while you are having symptoms.  Do not use the injured limb to support your body weight until your health care provider says that you can.  If physical therapy was prescribed, do exercises as told by your health care provider. General instructions  Ask your health care provider when it is safe to drive if you have a splint or brace on any part of your arm or leg.  Keep all follow-up visits as told by your health care provider. This is important. Contact a health care provider if:  Your symptoms are not improving or are getting worse. Get help right away if:  Your fingers or toes become  numb or turn blue.  You have a fever and more of any of the following symptoms: ? Pain. ? Redness. ? Warmth. ? Swelling. Summary  Tenosynovitis is inflammation of a tendon and of the sleeve of tissue that covers the tendon (tendon sheath).  Treatment for this condition depends on the cause. Treatment may include rest, medicines, physical therapy, or surgery.  Contact a health care provider if your symptoms are not improving or are getting worse.  Keep all follow-up visits as told by your health care provider. This is important. This information is not intended to replace advice given to you by your health care provider. Make sure you discuss any questions you have with your health care provider. Document Revised: 02/15/2018 Document Reviewed: 02/15/2018 Elsevier Patient Education  [place].  Please be sure medication list is accurate. If a new problem present, please set up appointment sooner than planned today.

## 2019-08-26 NOTE — Progress Notes (Signed)
ACUTE VISIT   HPI:  Chief Complaint  Patient presents with  . knee inflammation    [name] is a 54 y.o. female, who is here today complaining of left knee pain for about 10 days. Reports this problem as new.2/10, intermittent dull. No hx of trauma. At work she does a lot of lifting She has not problem going up or down stairs. Mild edema behind knee and mild limitation of ROM, improved. No erythema. + "Pops."  She has taken Naproxen and Ibuprofen.  She also mentions left lower back pain radiated to left buttocks, thigh, and posterior aspect of left knee. Am stiffness. She has had this problem for 2-3 months. Negative for lower extremity numbness, tingling, weakness, or burning. No saddle anesthesia or bowel/bladder dysfunction.  -Right wrist pain. Right handed. 2 days ago she started with radial wrist pain. Pain is mild, intermittent. Sore with thumb movement. Negative for wrist erythema or edema. No limitation of range of motion.   Review of Systems  Constitutional: Negative for activity change, appetite change, chills, fatigue and fever.  Cardiovascular: Negative for leg swelling.  Gastrointestinal: Negative for abdominal pain, nausea and vomiting.       No changes in bowel habits.  Genitourinary: Negative for decreased urine volume, dysuria and hematuria.  Musculoskeletal: Positive for arthralgias. Negative for gait problem.  Skin: Negative for color change and pallor.  Rest see pertinent positives and negatives per HPI.  No current outpatient medications on file prior to visit.   No current facility-administered medications on file prior to visit.   Past Medical History:  Diagnosis Date  . Fibroids    Allergies  Allergen Reactions  . Penicillins Hives    Social History   Socioeconomic History  . Marital status: Single    Spouse name: Not on file  . Number of children: Not on file  . Years of education: Not on file  . Highest  education level: Not on file  Occupational History  . Not on file  Tobacco Use  . Smoking status: Former Research scientist (life sciences)  . Smokeless tobacco: Never Used  Substance and Sexual Activity  . Alcohol use: Yes    Comment: occass.   . Drug use: No  . Sexual activity: Yes  Other Topics Concern  . Not on file  Social History Narrative  . Not on file   Social Determinants of Health   Financial Resource Strain:   . Difficulty of Paying Living Expenses: Not on file  Food Insecurity:   . Worried About Charity fundraiser in the Last Year: Not on file  . Ran Out of Food in the Last Year: Not on file  Transportation Needs:   . Lack of Transportation (Medical): Not on file  . Lack of Transportation (Non-Medical): Not on file  Physical Activity:   . Days of Exercise per Week: Not on file  . Minutes of Exercise per Session: Not on file  Stress:   . Feeling of Stress : Not on file  Social Connections:   . Frequency of Communication with Friends and Family: Not on file  . Frequency of Social Gatherings with Friends and Family: Not on file  . Attends Religious Services: Not on file  . Active Member of Clubs or Organizations: Not on file  . Attends Archivist Meetings: Not on file  . Marital Status: Not on file    Vitals:   08/26/19 1038  BP: 126/80  Pulse: 76  Resp: 12  Temp: (!) 96.2 F (35.7 C)  SpO2: 98%   Body mass index is 30.11 kg/m.  Physical Exam  Nursing note and vitals reviewed. Constitutional: She is oriented to person, place, and time. She appears well-developed. She does not appear ill. No distress.  HENT:  Head: Normocephalic and atraumatic.  Eyes: Conjunctivae are normal.  Cardiovascular: Normal rate and regular rhythm.  Pulses:      Dorsalis pedis pulses are 2+ on the right side and 2+ on the left side.  Calf tenderness: Negative bilateral. Varicose veins bilateral.  Respiratory: Effort normal and breath sounds normal. No respiratory distress.    Musculoskeletal:        General: No edema.     Right wrist: Tenderness present. Normal range of motion.       Back:     Right knee: Normal range of motion. No tenderness.     Comments: Right knee: on inspection no effusion, erythema, or deformities. Valgus and varus stress normal, McMurray negative (meniscus), anterior and posterior drawer test negative. Patellar apprehension test negative. Mild popliteal fossa fullness, no tenderness with palpation. + Mild crepitus with ROM.  Tenderness upon palpation of right radial styloid, no edema or erythema appreciated, no limitation of wrist ROM. Pain is not elicited on radial styloid with Finkelstein maneuver.  Neurological: She is alert and oriented to person, place, and time. She has normal strength. Gait normal.  Skin: Skin is warm. No rash noted. No erythema.  Psychiatric: She has a normal mood and affect.  Well groomed, good eye contact.   ASSESSMENT AND PLAN:  [name] was seen today for knee inflammation.  Diagnoses and all orders for this visit: Orders Placed This Encounter  Procedures  . DG Knee Complete 4 Views Right  . DG Lumbar Spine Complete    Acute pain of right knee Problem has improved. We discussed possible etiologies,?  OA. After discussion of some side effects, she agrees with short course of NSAIDs, Celebrex x7 days. Avoid activities that could cause knee pain. She would like imaging done, we do not have x-ray service today, she will be back in 1 to 2 weeks to have it done.  -     celecoxib (CELEBREX) 100 MG capsule; Take 1 capsule (100 mg total) by mouth 2 (two) times daily for 7 days.  Right wrist pain Clinical history suggest de Quervain's tenosynovitis. Celebrex 100 mg twice daily for 7 days. Recommend forearm splint with thumb-spica to wear for 4-6 weeks. F/U with PCP if needed.  -     celecoxib (CELEBREX) 100 MG capsule; Take 1 capsule (100 mg total) by mouth 2 (two) times daily for 7 days.  Right  low back pain, unspecified chronicity, unspecified whether sciatica present OA, SI among some to consider. We discussed some side effects of muscle relaxant. OTC IcyHot patch may also help. Instructed about warning signs. Follow-up with PCP.  -     tiZANidine (ZANAFLEX) 4 MG tablet; Take 0.5-1 tablets (2-4 mg total) by mouth every 12 (twelve) hours as needed for up to 15 days for muscle spasms.   Return if symptoms worsen or fail to improve, for keep next appt with PCP.   Fleeta Kunde G. [place], MD  [facility]. [place] office.

## 2019-09-04 ENCOUNTER — Other Ambulatory Visit: Payer: 59

## 2019-09-12 ENCOUNTER — Ambulatory Visit: Payer: 59 | Admitting: Family Medicine

## 2019-10-19 DIAGNOSIS — R591 Generalized enlarged lymph nodes: Secondary | ICD-10-CM | POA: Diagnosis not present

## 2019-10-23 ENCOUNTER — Telehealth: Payer: 59 | Admitting: Family Medicine

## 2019-10-23 ENCOUNTER — Telehealth: Payer: Self-pay | Admitting: Family Medicine

## 2019-10-23 NOTE — Telephone Encounter (Signed)
Left pt a voicemail for pt to call the office and schedule a MyChart video visit with Dr Volanda Napoleon

## 2019-10-23 NOTE — Telephone Encounter (Signed)
Patient went the Urgent Care Saturday they told her that she may have a Cyst/lymph node on her neck and she was wanting to talk with Dr. Volanda Napoleon.  She went to have it checked out because she thought it might be a side effect to the COVID vaccine.  Please advise

## 2019-10-23 NOTE — Telephone Encounter (Signed)
Pt schedule for office visit on 10/24/2019 at 11 am

## 2019-10-24 ENCOUNTER — Ambulatory Visit: Payer: 59 | Admitting: Family Medicine

## 2019-10-24 ENCOUNTER — Encounter: Payer: Self-pay | Admitting: Family Medicine

## 2019-10-24 ENCOUNTER — Other Ambulatory Visit: Payer: Self-pay

## 2019-10-24 VITALS — BP 118/80 | HR 67 | Temp 97.9°F | Wt 161.0 lb

## 2019-10-24 DIAGNOSIS — R590 Localized enlarged lymph nodes: Secondary | ICD-10-CM

## 2019-10-24 DIAGNOSIS — Z7189 Other specified counseling: Secondary | ICD-10-CM

## 2019-10-24 NOTE — Progress Notes (Signed)
Subjective:    Patient ID: [name], female    DOB: 12-12-1965, 54 y.o.   MRN: [id number]  No chief complaint on file.   HPI Patient was seen today for f/u.  Pt seen Sunday at [facility] for swelling in neck noted after receiving 1st dose of COVID vaccine on 4/2.  Pt denies pain, fever, chills, n/v.  States she was concerned about what it was and if she should get the 2nd vaccine.  Past Medical History:  Diagnosis Date  . Fibroids     Allergies  Allergen Reactions  . Penicillins Hives    ROS General: Denies fever, chills, night sweats, changes in weight, changes in appetite HEENT: Denies headaches, ear pain, changes in vision, rhinorrhea, sore throat  +lymphadenopathy CV: Denies CP, palpitations, SOB, orthopnea Pulm: Denies SOB, cough, wheezing GI: Denies abdominal pain, nausea, vomiting, diarrhea, constipation GU: Denies dysuria, hematuria, frequency, vaginal discharge Msk: Denies muscle cramps, joint pains Neuro: Denies weakness, numbness, tingling Skin: Denies rashes, bruising Psych: Denies depression, anxiety, hallucinations     Objective:    Blood pressure 118/80, pulse 67, temperature 97.9 F (36.6 C), temperature source Temporal, weight 161 lb (73 kg), last menstrual period 06/13/2018, SpO2 99 %.   Gen. Pleasant, well-nourished, in no distress, normal affect   HEENT: [place]/AT, face symmetric, no scleral icterus, PERRLA, EOMI, nares patent without drainage, pharynx without erythema or exudate. Neck: L cervical lymphadenopathy-a 1.5 cm, mobile, smooth, nontender node. Lungs: no accessory muscle use Cardiovascular: RRR, no peripheral edema Neuro:  A&Ox3, CN II-XII intact, normal gait Skin:  Warm, no lesions/ rash  Wt Readings from Last 3 Encounters:  10/24/19 161 lb (73 kg)  08/26/19 162 lb (73.5 kg)  06/25/19 158 lb (71.7 kg)    Lab Results  Component Value Date   WBC 6.3 10/02/2017   HGB 14.3 10/02/2017   HCT 42.6 10/02/2017   PLT 311.0 10/02/2017   GLUCOSE 94  10/02/2017   CHOL 142 12/06/2018   TRIG 67.0 12/06/2018   HDL 52.30 12/06/2018   LDLCALC 77 12/06/2018   NA 138 10/02/2017   K 4.2 10/02/2017   CL 104 10/02/2017   CREATININE 0.50 10/02/2017   BUN 11 10/02/2017   CO2 28 10/02/2017   TSH 2.55 12/06/2018   HGBA1C 6.0 12/06/2018    Assessment/Plan:  Lymphadenopathy of left cervical region -asymptomic  -expected immune response to COVID vaccine -CBC at [facility] on 10/20/19 normal -reassured -supportive care if needed  Educated about COVID-19 virus infection -discussed s/s of COVID infection -reviewed possible s/e of vaccine -pt encouraged to keep appt for 2nd COVID vaccine dose -questions answered to satisfaction  F/u prn.  CPE next month.  Grier Mitts, MD

## 2019-10-24 NOTE — Patient Instructions (Signed)

## 2019-11-27 ENCOUNTER — Ambulatory Visit: Payer: 59 | Admitting: Family Medicine

## 2019-12-12 ENCOUNTER — Ambulatory Visit: Payer: 59

## 2019-12-19 ENCOUNTER — Other Ambulatory Visit: Payer: Self-pay | Admitting: Obstetrics and Gynecology

## 2019-12-19 ENCOUNTER — Other Ambulatory Visit: Payer: Self-pay

## 2019-12-19 ENCOUNTER — Ambulatory Visit
Admission: RE | Admit: 2019-12-19 | Discharge: 2019-12-19 | Disposition: A | Discharge status: Home / Self Care | Payer: 59 | Source: Ambulatory Visit | Attending: Obstetrics and Gynecology | Admitting: Obstetrics and Gynecology

## 2019-12-19 DIAGNOSIS — Z1231 Encounter for screening mammogram for malignant neoplasm of breast: Secondary | ICD-10-CM

## 2019-12-19 LAB — HM MAMMOGRAPHY

## 2019-12-23 ENCOUNTER — Other Ambulatory Visit: Payer: Self-pay | Admitting: Obstetrics and Gynecology

## 2019-12-23 DIAGNOSIS — R928 Other abnormal and inconclusive findings on diagnostic imaging of breast: Secondary | ICD-10-CM

## 2019-12-23 DIAGNOSIS — D242 Benign neoplasm of left breast: Secondary | ICD-10-CM | POA: Insufficient documentation

## 2019-12-23 DIAGNOSIS — Z01419 Encounter for gynecological examination (general) (routine) without abnormal findings: Secondary | ICD-10-CM | POA: Diagnosis not present

## 2019-12-27 ENCOUNTER — Encounter: Payer: Self-pay | Admitting: Family Medicine

## 2019-12-27 ENCOUNTER — Ambulatory Visit
Admission: RE | Admit: 2019-12-27 | Discharge: 2019-12-27 | Disposition: A | Discharge status: Home / Self Care | Payer: 59 | Source: Ambulatory Visit | Attending: Obstetrics and Gynecology | Admitting: Obstetrics and Gynecology

## 2019-12-27 ENCOUNTER — Other Ambulatory Visit: Payer: Self-pay

## 2019-12-27 DIAGNOSIS — R928 Other abnormal and inconclusive findings on diagnostic imaging of breast: Secondary | ICD-10-CM | POA: Diagnosis not present

## 2019-12-27 DIAGNOSIS — N6012 Diffuse cystic mastopathy of left breast: Secondary | ICD-10-CM | POA: Diagnosis not present

## 2019-12-30 ENCOUNTER — Ambulatory Visit: Payer: 59 | Admitting: Family Medicine

## 2019-12-30 ENCOUNTER — Other Ambulatory Visit: Payer: 59

## 2020-01-01 ENCOUNTER — Other Ambulatory Visit: Payer: Self-pay

## 2020-01-02 ENCOUNTER — Ambulatory Visit (INDEPENDENT_AMBULATORY_CARE_PROVIDER_SITE_OTHER): Payer: 59 | Admitting: Family Medicine

## 2020-01-02 ENCOUNTER — Encounter: Payer: Self-pay | Admitting: Family Medicine

## 2020-01-02 VITALS — BP 110/80 | HR 78 | Temp 97.5°F | Wt 161.0 lb

## 2020-01-02 DIAGNOSIS — R5383 Other fatigue: Secondary | ICD-10-CM | POA: Diagnosis not present

## 2020-01-02 DIAGNOSIS — G8929 Other chronic pain: Secondary | ICD-10-CM

## 2020-01-02 DIAGNOSIS — R635 Abnormal weight gain: Secondary | ICD-10-CM | POA: Diagnosis not present

## 2020-01-02 DIAGNOSIS — Z1159 Encounter for screening for other viral diseases: Secondary | ICD-10-CM

## 2020-01-02 DIAGNOSIS — M25561 Pain in right knee: Secondary | ICD-10-CM

## 2020-01-02 DIAGNOSIS — Z1211 Encounter for screening for malignant neoplasm of colon: Secondary | ICD-10-CM

## 2020-01-02 DIAGNOSIS — R7303 Prediabetes: Secondary | ICD-10-CM

## 2020-01-02 DIAGNOSIS — Z13220 Encounter for screening for lipoid disorders: Secondary | ICD-10-CM

## 2020-01-02 LAB — POCT GLYCOSYLATED HEMOGLOBIN (HGB A1C): Hemoglobin A1C: 5.7 % — AB (ref 4.0–5.6)

## 2020-01-02 NOTE — Progress Notes (Signed)
Subjective:    Patient ID: [name], female    DOB: 03/22/1966, 54 y.o.   MRN: [id number]  No chief complaint on file.   HPI Patient was seen today for ongoing concern.  Pt endorses right knee pain times several months.  Pt seen in February for similar concern.  Pt endorses fullness in posterior right knee and pain in medial right knee with squatting.  Pt has not tried anything for her symptoms.  Pt endorses weight gain and fatigue.  Notes drinking more soda and teas at work.  Also likes eating sweets and french fries.  Pt works third shift at the hospital.  Pt concerned about diabetes as she has a family history.  Pt denies increased thirst, hunger, or urinary frequency.  Patient had physical with OB/GYN this week.  Requesting labs as not done during that visit.  Past Medical History:  Diagnosis Date   Fibroids     Allergies  Allergen Reactions   Penicillins Hives    ROS General: Denies fever, chills, night sweats, changes in weight, changes in appetite  +weight gain, fatigue HEENT: Denies headaches, ear pain, changes in vision, rhinorrhea, sore throat CV: Denies CP, palpitations, SOB, orthopnea Pulm: Denies SOB, cough, wheezing GI: Denies abdominal pain, nausea, vomiting, diarrhea, constipation GU: Denies dysuria, hematuria, frequency, vaginal discharge Msk: Denies muscle cramps, joint pains  +R knee pain Neuro: Denies weakness, numbness, tingling Skin: Denies rashes, bruising Psych: Denies depression, anxiety, hallucinations    Objective:    Blood pressure 110/80, pulse 78, temperature (!) 97.5 F (36.4 C), temperature source Temporal, weight 161 lb (73 kg), last menstrual period 06/13/2018, SpO2 99 %.  Gen. Pleasant, well-nourished, in no distress, normal affect   HEENT: /AT, face symmetric, no scleral icterus, PERRLA, EOMI, nares patent without drainage Lungs: no accessory muscle use, CTAB, no wheezes or rales Cardiovascular: RRR, no m/r/g, no peripheral  edema Musculoskeletal: No crepitus of bilateral knees.  Right knee with TTP at medial joint line and popliteal fossa.  Mild increased soreness in right popliteal fossa.  Normal ROM of bilateral LE.  No erythema, edema, effusion noted.  No deformities, no cyanosis or clubbing, normal tone.  Pain with squatting. Neuro:  A&Ox3, CN II-XII intact, normal gait Skin:  Warm, no lesions/ rash   Wt Readings from Last 3 Encounters:  01/02/20 161 lb (73 kg)  10/24/19 161 lb (73 kg)  08/26/19 162 lb (73.5 kg)    Lab Results  Component Value Date   WBC 6.3 10/02/2017   HGB 14.3 10/02/2017   HCT 42.6 10/02/2017   PLT 311.0 10/02/2017   GLUCOSE 94 10/02/2017   CHOL 142 12/06/2018   TRIG 67.0 12/06/2018   HDL 52.30 12/06/2018   LDLCALC 77 12/06/2018   NA 138 10/02/2017   K 4.2 10/02/2017   CL 104 10/02/2017   CREATININE 0.50 10/02/2017   BUN 11 10/02/2017   CO2 28 10/02/2017   TSH 2.55 12/06/2018   HGBA1C 6.0 12/06/2018    Assessment/Plan:  Chronic pain of right knee  -Discussed possible causes including arthritis, baker's cyst -Per review of note in February x-ray ordered at that time but not obtained. -Discussed supportive care including Tylenol arthritis strength, heat, supportive shoes, OTC analgesic rubs -Patient to try Voltaren gel as needed -Given handout -Patient to obtain x[facility]ray at [facility] as unavailable in clinic. - Plan: DG Knee Complete 4 Views Right  Prediabetes -hgb A1C 6.0 on 12/06/18 -Discussed lifestyle modifications -Given handout -Plan: POC Hgb  A1c  Weight gain -Discussed lifestyle modifications including decreasing soda, sweets, and carbohydrate intake -Given handout -Increase physical activity encouraged -We will continue to monitor -Consider TSH and free T4  Need For Hep C screening -Plan: hep C ab   Fatigue -possible 2/2 working 3 rd shift -Plan: CBC, Vit D  Screen for Cholesterol -Plan: lipid panel  F/u as needed in the next 1-2 months,  sooner if needed  Grier Mitts, MD

## 2020-01-02 NOTE — Patient Instructions (Addendum)
You can use Voltaren gel for your knee pain.  We can be found over-the-counter at your local drugstore, [employer], or [employer]. You can also take Tylenol arthritis strength if needed.  An x-ray of your knee has been ordered.  You can have this done at the [facility] clinic.  It is directly across the street from the [facility] emergency department.  They take walk-in appointments.   The address is [address], [place], [place] [address] The phone number is [phone].  They are open from 8:30 AM-5:15 pm.  Chronic Knee Pain, Adult Chronic knee pain is pain in one or both knees that lasts longer than 3 months. Symptoms of chronic knee pain may include swelling, stiffness, and discomfort. Age-related wear and tear (osteoarthritis) of the knee joint is the most common cause of chronic knee pain. Other possible causes include:  A long-term immune-related disease that causes inflammation of the knee (rheumatoid arthritis). This usually affects both knees.  Inflammatory arthritis, such as gout or pseudogout.  An injury to the knee that causes arthritis.  An injury to the knee that damages the ligaments. Ligaments are strong tissues that connect bones to each other.  Runner's knee or pain behind the kneecap. Treatment for chronic knee pain depends on the cause. The main treatments for chronic knee pain are physical therapy and weight loss. This condition may also be treated with medicines, injections, a knee sleeve or brace, and by using crutches. Rest, ice, compression (pressure), and elevation (RICE) therapy may also be recommended. Follow these instructions at home: If you have a knee sleeve or brace:   Wear it as told by your health care provider. Remove it only as told by your health care provider.  Loosen it if your toes tingle, become numb, or turn cold and blue.  Keep it clean.  If the sleeve or brace is not waterproof: ? Do not let it get wet. ? Remove it if allowed by your  health care provider, or cover it with a watertight covering when you take a bath or a shower. Managing pain, stiffness, and swelling      If directed, apply heat to the affected area as often as told by your health care provider. Use the heat source that your health care provider recommends, such as a moist heat pack or a heating pad. ? If you have a removable sleeve or brace, remove it as told by your health care provider. ? Place a towel between your skin and the heat source. ? Leave the heat on for 20-30 minutes. ? Remove the heat if your skin turns bright red. This is especially important if you are unable to feel pain, heat, or cold. You may have a greater risk of getting burned.  If directed, put ice on the affected area. ? If you have a removable sleeve or brace, remove it as told by your health care provider. ? Put ice in a plastic bag. ? Place a towel between your skin and the bag. ? Leave the ice on for 20 minutes, 2-3 times a day.  Move your toes often to reduce stiffness and swelling.  Raise (elevate) the injured area above the level of your heart while you are sitting or lying down. Activity  Avoid activities where both feet leave the ground at the same time (high-impact activities). Examples are running, jumping rope, and doing jumping jacks.  Return to your normal activities as told by your health care provider. Ask your  health care provider what activities are safe for you.  Follow the exercise plan that your health care provider designed for you. Your health care provider may suggest that you: ? Avoid activities that make knee pain worse. This may require you to change your exercise routines, sport participation, or job duties. ? Wear shoes with cushioned soles. ? Avoid high-impact activities or sports that require running and sudden changes in direction. ? Do physical therapy as told by your health care provider. Physical therapy is planned to match your needs and  abilities. It may include exercises for strength, flexibility, stability, and endurance. ? Do exercises that increase balance and strength, such as tai chi and yoga.  Do not use the injured limb to support your body weight until your health care provider says that you can. Use crutches, a cane, or a walker, as told by your health care provider. General instructions  Take over-the-counter and prescription medicines only as told by your health care provider.  Lose weight if you are overweight. Losing even a little weight can reduce knee pain. Ask your health care provider what your ideal weight is, and how to safely lose extra weight. A food expert (dietitian) may be able to help you plan your meals.  Do not use any products that contain nicotine or tobacco, such as cigarettes, e-cigarettes, and chewing tobacco. These can delay healing. If you need help quitting, ask your health care provider.  Keep all follow-up visits as told by your health care provider. This is important. Contact a health care provider if:  You have knee pain that is not getting better or gets worse.  You are unable to do your physical therapy exercises due to knee pain. Get help right away if:  Your knee swells and the swelling becomes worse.  You cannot move your knee.  You have severe knee pain. Summary  Knee pain that lasts more than 3 months is considered chronic knee pain.  The main treatments for chronic knee pain are physical therapy and weight loss. You may also need to take medicines, wear a knee sleeve or brace, use crutches, and apply ice or heat.  Losing even a little weight can reduce knee pain. Ask your health care provider what your ideal weight is, and how to safely lose extra weight. A food expert (dietitian) may be able to help you plan your meals.  Work with a physical therapist to make a safe exercise program, as told by your health care provider. This information is not intended to replace  advice given to you by your health care provider. Make sure you discuss any questions you have with your health care provider. Document Revised: 09/06/2018 Document Reviewed: 09/06/2018 Elsevier Patient Education  2020 [place] for [employer] Calories are units of energy. Your body needs a certain amount of calories from food to keep you going throughout the day. When you eat more calories than your body needs, your body stores the extra calories as fat. When you eat fewer calories than your body needs, your body burns fat to get the energy it needs. Calorie counting means keeping track of how many calories you eat and drink each day. Calorie counting can be helpful if you need to lose weight. If you make sure to eat fewer calories than your body needs, you should lose weight. Ask your health care provider what a healthy weight is for you. For calorie counting to work, you will need to eat  the right number of calories in a day in order to lose a healthy amount of weight per week. A dietitian can help you determine how many calories you need in a day and will give you suggestions on how to reach your calorie goal.  A healthy amount of weight to lose per week is usually 1-2 lb (0.5-0.9 kg). This usually means that your daily calorie intake should be reduced by 500-750 calories.  Eating 1,200 - 1,500 calories per day can help most women lose weight.  Eating 1,500 - 1,800 calories per day can help most men lose weight. What is my plan? My goal is to have __________ calories per day. If I have this many calories per day, I should lose around __________ pounds per week. What do I need to know about calorie counting? In order to meet your daily calorie goal, you will need to:  Find out how many calories are in each food you would like to eat. Try to do this before you eat.  Decide how much of the food you plan to eat.  Write down what you ate and how many calories it had.  Doing this is called keeping a food log. To successfully lose weight, it is important to balance calorie counting with a healthy lifestyle that includes regular activity. Aim for 150 minutes of moderate exercise (such as walking) or 75 minutes of vigorous exercise (such as running) each week. Where do I find calorie information?  The number of calories in a food can be found on a Nutrition Facts label. If a food does not have a Nutrition Facts label, try to look up the calories online or ask your dietitian for help. Remember that calories are listed per serving. If you choose to have more than one serving of a food, you will have to multiply the calories per serving by the amount of servings you plan to eat. For example, the label on a package of bread might say that a serving size is 1 slice and that there are 90 calories in a serving. If you eat 1 slice, you will have eaten 90 calories. If you eat 2 slices, you will have eaten 180 calories. How do I keep a food log? Immediately after each meal, record the following information in your food log:  What you ate. Don't forget to include toppings, sauces, and other extras on the food.  How much you ate. This can be measured in cups, ounces, or number of items.  How many calories each food and drink had.  The total number of calories in the meal. Keep your food log near you, such as in a small notebook in your pocket, or use a mobile app or website. Some programs will calculate calories for you and show you how many calories you have left for the day to meet your goal. What are some calorie counting tips?   Use your calories on foods and drinks that will fill you up and not leave you hungry: ? Some examples of foods that fill you up are nuts and nut butters, vegetables, lean proteins, and high-fiber foods like whole grains. High-fiber foods are foods with more than 5 g fiber per serving. ? Drinks such as sodas, specialty coffee drinks, alcohol, and  juices have a lot of calories, yet do not fill you up.  Eat nutritious foods and avoid empty calories. Empty calories are calories you get from foods or beverages that do not have many vitamins or  protein, such as candy, sweets, and soda. It is better to have a nutritious high-calorie food (such as an avocado) than a food with few nutrients (such as a bag of chips).  Know how many calories are in the foods you eat most often. This will help you calculate calorie counts faster.  Pay attention to calories in drinks. Low-calorie drinks include water and unsweetened drinks.  Pay attention to nutrition labels for "low fat" or "fat free" foods. These foods sometimes have the same amount of calories or more calories than the full fat versions. They also often have added sugar, starch, or salt, to make up for flavor that was removed with the fat.  Find a way of tracking calories that works for you. Get creative. Try different apps or programs if writing down calories does not work for you. What are some portion control tips?  Know how many calories are in a serving. This will help you know how many servings of a certain food you can have.  Use a measuring cup to measure serving sizes. You could also try weighing out portions on a kitchen scale. With time, you will be able to estimate serving sizes for some foods.  Take some time to put servings of different foods on your favorite plates, bowls, and cups so you know what a serving looks like.  Try not to eat straight from a bag or box. Doing this can lead to overeating. Put the amount you would like to eat in a cup or on a plate to make sure you are eating the right portion.  Use smaller plates, glasses, and bowls to prevent overeating.  Try not to multitask (for example, watch TV or use your computer) while eating. If it is time to eat, sit down at a table and enjoy your food. This will help you to know when you are full. It will also help you to be  aware of what you are eating and how much you are eating. What are tips for following this plan? Reading food labels  Check the calorie count compared to the serving size. The serving size may be smaller than what you are used to eating.  Check the source of the calories. Make sure the food you are eating is high in vitamins and protein and low in saturated and trans fats. Shopping  Read nutrition labels while you shop. This will help you make healthy decisions before you decide to purchase your food.  Make a grocery list and stick to it. Cooking  Try to cook your favorite foods in a healthier way. For example, try baking instead of frying.  Use low-fat dairy products. Meal planning  Use more fruits and vegetables. Half of your plate should be fruits and vegetables.  Include lean proteins like poultry and fish. How do I count calories when eating out?  Ask for smaller portion sizes.  Consider sharing an entree and sides instead of getting your own entree.  If you get your own entree, eat only half. Ask for a box at the beginning of your meal and put the rest of your entree in it so you are not tempted to eat it.  If calories are listed on the menu, choose the lower calorie options.  Choose dishes that include vegetables, fruits, whole grains, low-fat dairy products, and lean protein.  Choose items that are boiled, broiled, grilled, or steamed. Stay away from items that are buttered, battered, fried, or served with cream sauce. Items  labeled "crispy" are usually fried, unless stated otherwise.  Choose water, low-fat milk, unsweetened iced tea, or other drinks without added sugar. If you want an alcoholic beverage, choose a lower calorie option such as a glass of wine or light beer.  Ask for dressings, sauces, and syrups on the side. These are usually high in calories, so you should limit the amount you eat.  If you want a salad, choose a garden salad and ask for grilled meats.  Avoid extra toppings like bacon, cheese, or fried items. Ask for the dressing on the side, or ask for olive oil and vinegar or lemon to use as dressing.  Estimate how many servings of a food you are given. For example, a serving of cooked rice is  cup or about the size of half a baseball. Knowing serving sizes will help you be aware of how much food you are eating at restaurants. The list below tells you how big or small some common portion sizes are based on everyday objects: ? 1 oz--4 stacked dice. ? 3 oz--1 deck of cards. ? 1 tsp--1 die. ? 1 Tbsp-- a ping-pong ball. ? 2 Tbsp--1 ping-pong ball. ?  cup-- baseball. ? 1 cup--1 baseball. Summary  Calorie counting means keeping track of how many calories you eat and drink each day. If you eat fewer calories than your body needs, you should lose weight.  A healthy amount of weight to lose per week is usually 1-2 lb (0.5-0.9 kg). This usually means reducing your daily calorie intake by 500-750 calories.  The number of calories in a food can be found on a Nutrition Facts label. If a food does not have a Nutrition Facts label, try to look up the calories online or ask your dietitian for help.  Use your calories on foods and drinks that will fill you up, and not on foods and drinks that will leave you hungry.  Use smaller plates, glasses, and bowls to prevent overeating. This information is not intended to replace advice given to you by your health care provider. Make sure you discuss any questions you have with your health care provider. Document Revised: 03/16/2018 Document Reviewed: 05/27/2016 Elsevier Patient Education  2020 [employer].  Exercising to Lose Weight Exercise is structured, repetitive physical activity to improve fitness and health. Getting regular exercise is important for everyone. It is especially important if you are overweight. Being overweight increases your risk of heart disease, stroke, diabetes, high blood pressure,  and several types of cancer. Reducing your calorie intake and exercising can help you lose weight. Exercise is usually categorized as moderate or vigorous intensity. To lose weight, most people need to do a certain amount of moderate-intensity or vigorous-intensity exercise each week. Moderate-intensity exercise  Moderate-intensity exercise is any activity that gets you moving enough to burn at least three times more energy (calories) than if you were sitting. Examples of moderate exercise include:  Walking a mile in 15 minutes.  Doing light yard work.  Biking at an easy pace. Most people should get at least 150 minutes (2 hours and 30 minutes) a week of moderate-intensity exercise to maintain their body weight. Vigorous-intensity exercise Vigorous-intensity exercise is any activity that gets you moving enough to burn at least six times more calories than if you were sitting. When you exercise at this intensity, you should be working hard enough that you are not able to carry on a conversation. Examples of vigorous exercise include:  Running.  Playing a team  sport, such as football, basketball, and soccer.  Jumping rope. Most people should get at least 75 minutes (1 hour and 15 minutes) a week of vigorous-intensity exercise to maintain their body weight. How can exercise affect me? When you exercise enough to burn more calories than you eat, you lose weight. Exercise also reduces body fat and builds muscle. The more muscle you have, the more calories you burn. Exercise also:  Improves mood.  Reduces stress and tension.  Improves your overall fitness, flexibility, and endurance.  Increases bone strength. The amount of exercise you need to lose weight depends on:  Your age.  The type of exercise.  Any health conditions you have.  Your overall physical ability. Talk to your health care provider about how much exercise you need and what types of activities are safe for  you. What actions can I take to lose weight? Nutrition   Make changes to your diet as told by your health care provider or diet and nutrition specialist (dietitian). This may include: ? Eating fewer calories. ? Eating more protein. ? Eating less unhealthy fats. ? Eating a diet that includes fresh fruits and vegetables, whole grains, low-fat dairy products, and lean protein. ? Avoiding foods with added fat, salt, and sugar.  Drink plenty of water while you exercise to prevent dehydration or heat stroke. Activity  Choose an activity that you enjoy and set realistic goals. Your health care provider can help you make an exercise plan that works for you.  Exercise at a moderate or vigorous intensity most days of the week. ? The intensity of exercise may vary from person to person. You can tell how intense a workout is for you by paying attention to your breathing and heartbeat. Most people will notice their breathing and heartbeat get faster with more intense exercise.  Do resistance training twice each week, such as: ? Push-ups. ? Sit-ups. ? Lifting weights. ? Using resistance bands.  Getting short amounts of exercise can be just as helpful as long structured periods of exercise. If you have trouble finding time to exercise, try to include exercise in your daily routine. ? Get up, stretch, and walk around every 30 minutes throughout the day. ? Go for a walk during your lunch break. ? Park your car farther away from your destination. ? If you take public transportation, get off one stop early and walk the rest of the way. ? Make phone calls while standing up and walking around. ? Take the stairs instead of elevators or escalators.  Wear comfortable clothes and shoes with good support.  Do not exercise so much that you hurt yourself, feel dizzy, or get very short of breath. Where to find more information  [place] Department of Health and Human Services: [employer]  Centers for  Disease Control and Prevention (CDC): http://www.wolf.info/ Contact a health care provider:  Before starting a new exercise program.  If you have questions or concerns about your weight.  If you have a medical problem that keeps you from exercising. Get help right away if you have any of the following while exercising:  Injury.  Dizziness.  Difficulty breathing or shortness of breath that does not go away when you stop exercising.  Chest pain.  Rapid heartbeat. Summary  Being overweight increases your risk of heart disease, stroke, diabetes, high blood pressure, and several types of cancer.  Losing weight happens when you burn more calories than you eat.  Reducing the amount of calories you eat in addition  to getting regular moderate or vigorous exercise each week helps you lose weight. This information is not intended to replace advice given to you by your health care provider. Make sure you discuss any questions you have with your health care provider. Document Revised: 07/10/2017 Document Reviewed: 07/10/2017 Elsevier Patient Education  [place].  Preventing Unhealthy [employer], Adult Staying at a healthy weight is important to your overall health. When fat builds up in your body, you may become overweight or obese. Being overweight or obese increases your risk of developing certain health problems, such as heart disease, diabetes, sleeping problems, joint problems, and some types of cancer. Unhealthy weight gain is often the result of making unhealthy food choices or not getting enough exercise. You can make changes to your lifestyle to prevent obesity and stay as healthy as possible. What nutrition changes can be made?   Eat only as much as your body needs. To do this: ? Pay attention to signs that you are hungry or full. Stop eating as soon as you feel full. ? If you feel hungry, try drinking water first before eating. Drink enough water so your urine is clear or pale  yellow. ? Eat smaller portions. Pay attention to portion sizes when eating out. ? Look at serving sizes on food labels. Most foods contain more than one serving per container. ? Eat the recommended number of calories for your gender and activity level. For most active people, a daily total of 2,000 calories is appropriate. If you are trying to lose weight or are not very active, you may need to eat fewer calories. Talk with your health care provider or a diet and nutrition specialist (dietitian) about how many calories you need each day.  Choose healthy foods, such as: ? Fruits and vegetables. At each meal, try to fill at least half of your plate with fruits and vegetables. ? Whole grains, such as whole-wheat bread, brown rice, and quinoa. ? Lean meats, such as chicken or fish. ? Other healthy proteins, such as beans, eggs, or tofu. ? Healthy fats, such as nuts, seeds, fatty fish, and olive oil. ? Low-fat or fat-free dairy products.  Check food labels, and avoid food and drinks that: ? Are high in calories. ? Have added sugar. ? Are high in sodium. ? Have saturated fats or trans fats.  Cook foods in healthier ways, such as by baking, broiling, or grilling.  Make a meal plan for the week, and shop with a grocery list to help you stay on track with your purchases. Try to avoid going to the grocery store when you are hungry.  When grocery shopping, try to shop around the outside of the store first, where the fresh foods are. Doing this helps you to avoid prepackaged foods, which can be high in sugar, salt (sodium), and fat. What lifestyle changes can be made?   Exercise for 30 or more minutes on 5 or more days each week. Exercising may include brisk walking, yard work, biking, running, swimming, and team sports like basketball and soccer. Ask your health care provider which exercises are safe for you.  Do muscle-strengthening activities, such as lifting weights or using resistance bands, on  2 or more days a week.  Do not use any products that contain nicotine or tobacco, such as cigarettes and e-cigarettes. If you need help quitting, ask your health care provider.  Limit alcohol intake to no more than 1 drink a day for nonpregnant women and 2  drinks a day for men. One drink equals 12 oz of beer, 5 oz of wine, or 1 oz of hard liquor.  Try to get 7-9 hours of sleep each night. What other changes can be made?  Keep a food and activity journal to keep track of: ? What you ate and how many calories you had. Remember to count the calories in sauces, dressings, and side dishes. ? Whether you were active, and what exercises you did. ? Your calorie, weight, and activity goals.  Check your weight regularly. Track any changes. If you notice you have gained weight, make changes to your diet or activity routine.  Avoid taking weight-loss medicines or supplements. Talk to your health care provider before starting any new medicine or supplement.  Talk to your health care provider before trying any new diet or exercise plan. Why are these changes important? Eating healthy, staying active, and having healthy habits can help you to prevent obesity. Those changes also:  Help you manage stress and emotions.  Help you connect with friends and family.  Improve your self-esteem.  Improve your sleep.  Prevent long-term health problems. What can happen if changes are not made? Being obese or overweight can cause you to develop joint or bone problems, which can make it hard for you to stay active or do activities you enjoy. Being obese or overweight also puts stress on your heart and lungs and can lead to health problems like diabetes, heart disease, and some cancers. Where to find more information Talk with your health care provider or a dietitian about healthy eating and healthy lifestyle choices. You may also find information from:  U.S. Department of Agriculture, MyPlate:  FormerBoss.no  American Heart Association: www.heart.org  Centers for Disease Control and Prevention: http://www.wolf.info/ Summary  Staying at a healthy weight is important to your overall health. It helps you to prevent certain diseases and health problems, such as heart disease, diabetes, joint problems, sleep disorders, and some types of cancer.  Being obese or overweight can cause you to develop joint or bone problems, which can make it hard for you to stay active or do activities you enjoy.  You can prevent unhealthy weight gain by eating a healthy diet, exercising regularly, not smoking, limiting alcohol, and getting enough sleep.  Talk with your health care provider or a dietitian for guidance about healthy eating and healthy lifestyle choices. This information is not intended to replace advice given to you by your health care provider. Make sure you discuss any questions you have with your health care provider. Document Revised: 06/30/2017 Document Reviewed: 08/03/2016 Elsevier Patient Education  2020 [place].  Preventing Type 2 Diabetes Mellitus Type 2 diabetes (type 2 diabetes mellitus) is a long-term (chronic) disease that affects blood sugar (glucose) levels. Normally, a hormone called insulin allows glucose to enter cells in the body. The cells use glucose for energy. In type 2 diabetes, one or both of these problems may be present:  The body does not make enough insulin.  The body does not respond properly to insulin that it makes (insulin resistance). Insulin resistance or lack of insulin causes excess glucose to build up in the blood instead of going into cells. As a result, high blood glucose (hyperglycemia) develops, which can cause many complications. Being overweight or obese and having an inactive (sedentary) lifestyle can increase your risk for diabetes. Type 2 diabetes can be delayed or prevented by making certain nutrition and lifestyle changes. What nutrition  changes can  be made?   Eat healthy meals and snacks regularly. Keep a healthy snack with you for when you get hungry between meals, such as fruit or a handful of nuts.  Eat lean meats and proteins that are low in saturated fats, such as chicken, fish, egg whites, and beans. Avoid processed meats.  Eat plenty of fruits and vegetables and plenty of grains that have not been processed (whole grains). It is recommended that you eat: ? 1?2 cups of fruit every day. ? 2?3 cups of vegetables every day. ? 6?8 oz of whole grains every day, such as oats, whole wheat, bulgur, brown rice, quinoa, and millet.  Eat low-fat dairy products, such as milk, yogurt, and cheese.  Eat foods that contain healthy fats, such as nuts, avocado, olive oil, and canola oil.  Drink water throughout the day. Avoid drinks that contain added sugar, such as soda or sweet tea.  Follow instructions from your health care provider about specific eating or drinking restrictions.  Control how much food you eat at a time (portion size). ? Check food labels to find out the serving sizes of foods. ? Use a kitchen scale to weigh amounts of foods.  Saute or steam food instead of frying it. Cook with water or broth instead of oils or butter.  Limit your intake of: ? Salt (sodium). Have no more than 1 tsp (2,400 mg) of sodium a day. If you have heart disease or high blood pressure, have less than ? tsp (1,500 mg) of sodium a day. ? Saturated fat. This is fat that is solid at room temperature, such as butter or fat on meat. What lifestyle changes can be made? Activity   Do moderate-intensity physical activity for at least 30 minutes on at least 5 days of the week, or as much as told by your health care provider.  Ask your health care provider what activities are safe for you. A mix of physical activities may be best, such as walking, swimming, cycling, and strength training.  Try to add physical activity into your day. For  example: ? Park in spots that are farther away than usual, so that you walk more. For example, park in a far corner of the parking lot when you go to the office or the grocery store. ? Take a walk during your lunch break. ? Use stairs instead of elevators or escalators. Weight Loss  Lose weight as directed. Your health care provider can determine how much weight loss is best for you and can help you lose weight safely.  If you are overweight or obese, you may be instructed to lose at least 5?7 % of your body weight. Alcohol and Tobacco   Limit alcohol intake to no more than 1 drink a day for nonpregnant women and 2 drinks a day for men. One drink equals 12 oz of beer, 5 oz of wine, or 1 oz of hard liquor.  Do not use any tobacco products, such as cigarettes, chewing tobacco, and e-cigarettes. If you need help quitting, ask your health care provider. Work With [facility] Provider  Have your blood glucose tested regularly, as told by your health care provider.  Discuss your risk factors and how you can reduce your risk for diabetes.  Get screening tests as told by your health care provider. You may have screening tests regularly, especially if you have certain risk factors for type 2 diabetes.  Make an appointment with a diet and nutrition specialist (registered dietitian).  A registered dietitian can help you make a healthy eating plan and can help you understand portion sizes and food labels. Why are these changes important?  It is possible to prevent or delay type 2 diabetes and related health problems by making lifestyle and nutrition changes.  It can be difficult to recognize signs of type 2 diabetes. The best way to avoid possible damage to your body is to take actions to prevent the disease before you develop symptoms. What can happen if changes are not made?  Your blood glucose levels may keep increasing. Having high blood glucose for a long time is dangerous. Too much  glucose in your blood can damage your blood vessels, heart, kidneys, nerves, and eyes.  You may develop prediabetes or type 2 diabetes. Type 2 diabetes can lead to many chronic health problems and complications, such as: ? Heart disease. ? Stroke. ? Blindness. ? Kidney disease. ? Depression. ? Poor circulation in the feet and legs, which could lead to surgical removal (amputation) in severe cases. Where to find support  Ask your health care provider to recommend a registered dietitian, diabetes educator, or weight loss program.  Look for local or online weight loss groups.  Join a gym, fitness club, or outdoor activity group, such as a walking club. Where to find more information To learn more about diabetes and diabetes prevention, visit:  American Diabetes Association (ADA): www.diabetes.[employer] of Diabetes and Digestive and Kidney Diseases: FindSpin.nl To learn more about healthy eating, visit:  The U.S. Department of Agriculture Scientist, research (physical sciences)), Choose My Plate: http://wiley-williams.com/  Office of Disease Prevention and Health Promotion (ODPHP), Dietary Guidelines: SurferLive.at Summary  You can reduce your risk for type 2 diabetes by increasing your physical activity, eating healthy foods, and losing weight as directed.  Talk with your health care provider about your risk for type 2 diabetes. Ask about any blood tests or screening tests that you need to have. This information is not intended to replace advice given to you by your health care provider. Make sure you discuss any questions you have with your health care provider. Document Revised: 10/19/2018 Document Reviewed: 08/18/2015 Elsevier Patient Education  [place].

## 2020-01-05 ENCOUNTER — Encounter: Payer: Self-pay | Admitting: Family Medicine

## 2020-01-05 NOTE — Addendum Note (Signed)
Addended by: Grier Mitts R on: 01/05/2020 10:08 PM   Modules accepted: Orders

## 2020-01-07 ENCOUNTER — Telehealth: Payer: Self-pay | Admitting: Family Medicine

## 2020-01-07 NOTE — Telephone Encounter (Signed)
The patient called to talk with [name] about something that is on her AVS that she has a question about.  Please advise

## 2020-01-08 NOTE — Telephone Encounter (Signed)
Spoke to pt and she was concerned about the Hep C screening note at the bottom of her AVS. Pt was scared that Dr.Banks " saw something that she didn't tell me about and wants me to get screened for Hep C". I discuss with pt the importance of HM and why the note was on her AVS. Pt verbalized understanding and has no further issues. Pt code sent so that pt could set up Mychart.

## 2020-01-08 NOTE — Telephone Encounter (Signed)
Pt is calling back wanting to see if [name] could please give her a call back today to discuss something personal.

## 2020-01-10 ENCOUNTER — Other Ambulatory Visit: Payer: Self-pay

## 2020-01-10 ENCOUNTER — Ambulatory Visit (INDEPENDENT_AMBULATORY_CARE_PROVIDER_SITE_OTHER)
Admission: RE | Admit: 2020-01-10 | Discharge: 2020-01-10 | Disposition: A | Discharge status: Home / Self Care | Payer: 59 | Source: Ambulatory Visit | Attending: Family Medicine | Admitting: Family Medicine

## 2020-01-10 DIAGNOSIS — M25561 Pain in right knee: Secondary | ICD-10-CM | POA: Diagnosis not present

## 2020-01-10 DIAGNOSIS — G8929 Other chronic pain: Secondary | ICD-10-CM

## 2020-01-15 ENCOUNTER — Telehealth: Payer: Self-pay | Admitting: Family Medicine

## 2020-01-15 NOTE — Telephone Encounter (Signed)
Please advise 

## 2020-01-15 NOTE — Telephone Encounter (Signed)
Pt is calling again wanting to see if she can get her xray results.  Pt is aware that someone will give her a call when the provider has resulted the xray.

## 2020-01-15 NOTE — Telephone Encounter (Signed)
Pt is calling in wanting her xray results for her R knee pt is aware someone will give her a call when the provider has resulted it.

## 2020-01-16 NOTE — Telephone Encounter (Signed)
Pt is calling again with concern about her xray results and would like to see if she can get a call back today.  She is aware provider is in seeing pts and someone will give her a call back.

## 2020-01-16 NOTE — Telephone Encounter (Signed)
X-ray with mild arthritis.

## 2020-01-17 NOTE — Telephone Encounter (Signed)
Patient notified and verbalized understanding. 

## 2020-01-29 ENCOUNTER — Encounter: Payer: Self-pay | Admitting: Gastroenterology

## 2020-03-31 ENCOUNTER — Ambulatory Visit: Payer: 59 | Admitting: Internal Medicine

## 2020-04-07 ENCOUNTER — Encounter: Payer: 59 | Admitting: Gastroenterology

## 2020-08-03 ENCOUNTER — Other Ambulatory Visit: Payer: Self-pay | Admitting: Obstetrics and Gynecology

## 2020-08-03 DIAGNOSIS — Z1231 Encounter for screening mammogram for malignant neoplasm of breast: Secondary | ICD-10-CM

## 2020-09-14 ENCOUNTER — Telehealth (INDEPENDENT_AMBULATORY_CARE_PROVIDER_SITE_OTHER): Payer: 59 | Admitting: Family Medicine

## 2020-09-14 ENCOUNTER — Encounter: Payer: Self-pay | Admitting: Family Medicine

## 2020-09-14 DIAGNOSIS — R635 Abnormal weight gain: Secondary | ICD-10-CM | POA: Diagnosis not present

## 2020-09-14 DIAGNOSIS — M1990 Unspecified osteoarthritis, unspecified site: Secondary | ICD-10-CM | POA: Diagnosis not present

## 2020-09-14 DIAGNOSIS — L309 Dermatitis, unspecified: Secondary | ICD-10-CM

## 2020-09-14 DIAGNOSIS — G4700 Insomnia, unspecified: Secondary | ICD-10-CM | POA: Diagnosis not present

## 2020-09-14 NOTE — Progress Notes (Signed)
Virtual Visit via Telephone Note  I connected with [name] on 09/14/20 at  3:00 PM EST by telephone and verified that I am speaking with the correct person using two identifiers.   I discussed the limitations, risks, security and privacy concerns of performing an evaluation and management service by telephone and the availability of in person appointments. I also discussed with the patient that there may be a patient responsible charge related to this service. The patient expressed understanding and agreed to proceed.  Location patient: home Location provider: work or home office Participants present for the call: patient, provider Patient did not have a visit in the prior 7 days to address this/these issue(s).   History of Present Illness: Pt with a rash of fine, red bumps on neck.  Pt states it does not itch.  Pt has been in the sun and using perfume.  Pt did switch detergents, but denies rash elsewhere.   Pt having issues sleeping.  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  Gets home a little before 3 AM, then gets up at 430 am.  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  May get in bed by 12:30 pm, but finds it difficulty to go to sleep.  Drinking coffee and monster energy drinks.  Eating even when not hungry.  Pt will eat a whole bag of chips.  States started doing this after her sister died.  Pt has not thought about counseling.  Knee stiff.  Told had arthritis.     Observations/Objective: Patient sounds cheerful and well on the phone. I do not appreciate any SOB. Speech and thought processing are grossly intact. Patient reported vitals:  Assessment and Plan: Dermatitis -Difficult to assess via phone visit. -Consider possible causes including heat rash -will try OTC cortisone BID x 5 days -Follow-up in clinic for continued symptoms  Insomnia, unspecified type -discussed decreasing caffeine intake -Discussed sleep hygiene -We will  likely have difficulty with being given current work schedule.  Arthritis -Right knee -Discussed supportive care including Tylenol arthritis, topical analgesics, heat -Continue to monitor  Weight gain -Likely 2/2 eating for comfort and work schedule. -Advised to consider counseling.  Patient declines at this time. -Discussed supportive care -Continue to monitor   Follow Up Instructions: Follow-up as needed 1 week  I did not refer this patient for an OV in the next 24 hours for this/these issue(s).  I discussed the assessment and treatment plan with the patient. The patient was provided an opportunity to ask questions and all were answered. The patient agreed with the plan and demonstrated an understanding of the instructions.   The patient was advised to call back or seek an in-person evaluation if the symptoms worsen or if the condition fails to improve as anticipated.  I provided 13 minutes of non-face-to-face time during this encounter.   Billie Ruddy, MD

## 2020-09-23 ENCOUNTER — Other Ambulatory Visit: Payer: Self-pay

## 2020-09-24 ENCOUNTER — Other Ambulatory Visit (HOSPITAL_COMMUNITY)
Admission: RE | Admit: 2020-09-24 | Discharge: 2020-09-24 | Disposition: A | Discharge status: Home / Self Care | Payer: 59 | Source: Ambulatory Visit | Attending: Family Medicine | Admitting: Family Medicine

## 2020-09-24 ENCOUNTER — Encounter: Payer: Self-pay | Admitting: Family Medicine

## 2020-09-24 ENCOUNTER — Ambulatory Visit: Payer: 59 | Admitting: Family Medicine

## 2020-09-24 VITALS — BP 132/90 | HR 75 | Temp 98.3°F | Wt 171.0 lb

## 2020-09-24 DIAGNOSIS — R635 Abnormal weight gain: Secondary | ICD-10-CM

## 2020-09-24 DIAGNOSIS — R030 Elevated blood-pressure reading, without diagnosis of hypertension: Secondary | ICD-10-CM | POA: Diagnosis not present

## 2020-09-24 DIAGNOSIS — N760 Acute vaginitis: Secondary | ICD-10-CM | POA: Insufficient documentation

## 2020-09-24 LAB — POCT URINALYSIS DIPSTICK
Bilirubin, UA: NEGATIVE
Blood, UA: NEGATIVE
Glucose, UA: NEGATIVE
Ketones, UA: NEGATIVE
Leukocytes, UA: NEGATIVE
Nitrite, UA: NEGATIVE
Protein, UA: NEGATIVE
Spec Grav, UA: 1.02 (ref 1.010–1.025)
Urobilinogen, UA: 0.2 E.U./dL
pH, UA: 6 (ref 5.0–8.0)

## 2020-09-24 LAB — POCT GLYCOSYLATED HEMOGLOBIN (HGB A1C): Hemoglobin A1C: 5.6 % (ref 4.0–5.6)

## 2020-09-24 MED ORDER — FLUCONAZOLE 150 MG PO TABS: 150.0000 mg | ORAL_TABLET | Freq: Once | ORAL | 0 refills | Status: AC

## 2020-09-24 NOTE — Progress Notes (Signed)
Subjective:    Patient ID: [name], female    DOB: 1965-11-20, 55 y.o.   MRN: [id number]  No chief complaint on file.   HPI Patient is 55 yo female with pmh sig for fibroids who was seen today for acute concern.  Pt endorses vaginal irritation, erythema, and mild d/c x a few days.  Started after using bath salts.  Pt also uses scented lotions/sprays in vaginal area.  Pt inquires about OTC keto weight loss supplements (pills and a liquid).  Pt purchased them, but has not started them.  Pt notes weight gain.  States eating junk food when not hungry, drinking several sodas and Monster energy drinks daily.  Pt worried about her health since the death of her sister.  Pt caring for her mother who has kidney dz and other chronic issues.  Pt concerned about coming into contact with COVID at work and making her mother sick.  Requesting a note for work.  Pt notes rash on neck, as mentioned during previous e-visit has resolved.    Past Medical History:  Diagnosis Date  . Fibroids     Allergies  Allergen Reactions  . Penicillins Hives    ROS General: Denies fever, chills, night sweats, changes in weight, changes in appetite HEENT: Denies headaches, ear pain, changes in vision, rhinorrhea, sore throat CV: Denies CP, palpitations, SOB, orthopnea Pulm: Denies SOB, cough, wheezing GI: Denies abdominal pain, nausea, vomiting, diarrhea, constipation GU: Denies dysuria, hematuria, frequency, +vaginal discharge and irritation Msk: Denies muscle cramps, joint pains Neuro: Denies weakness, numbness, tingling Skin: Denies rashes, bruising Psych: Denies depression, anxiety, hallucinations     Objective:    Blood pressure 132/90, pulse 75, temperature 98.3 F (36.8 C), temperature source Oral, weight 171 lb (77.6 kg), last menstrual period 06/13/2018, SpO2 98 %.  Gen. Pleasant, well-nourished, in no distress, normal affect   HEENT: [place]/AT, face symmetric, conjunctiva clear, no scleral icterus,  PERRLA, EOMI, nares patent without drainage Lungs: no accessory muscle use Cardiovascular: RRR, no peripheral edema GU: aptima self swab collected Musculoskeletal: No deformities, no cyanosis or clubbing, normal tone Neuro:  A&Ox3, CN II-XII intact, normal gait Skin:  Warm, no lesions/ rash   Wt Readings from Last 3 Encounters:  09/24/20 171 lb (77.6 kg)  01/02/20 161 lb (73 kg)  10/24/19 161 lb (73 kg)    Lab Results  Component Value Date   WBC 6.3 10/02/2017   HGB 14.3 10/02/2017   HCT 42.6 10/02/2017   PLT 311.0 10/02/2017   GLUCOSE 94 10/02/2017   CHOL 142 12/06/2018   TRIG 67.0 12/06/2018   HDL 52.30 12/06/2018   LDLCALC 77 12/06/2018   NA 138 10/02/2017   K 4.2 10/02/2017   CL 104 10/02/2017   CREATININE 0.50 10/02/2017   BUN 11 10/02/2017   CO2 28 10/02/2017   TSH 2.55 12/06/2018   HGBA1C 5.7 (A) 01/02/2020    Assessment/Plan:  Acute vaginitis  -pt advised to avoid using scented products. -will start diflucan 150 mg once.  Further recs based on results of Aptima test - Plan: Cervicovaginal ancillary only, fluconazole (DIFLUCAN) 150 MG tablet, POCT urinalysis dipstick  Weight gain  -counseling encouraged as pt eating for comfort. -given a list of [facility] providers -lifestyle modifications encouraged including decreasing soda intake - Plan: POC HgB A1c  Elevated blood-pressure reading without diagnosis of hypertension -lifestyle modifications encouraged -pt to check bp at home -decrease intake of junk food -will f/u on bp in 1 month  F/u  prn  Grier Mitts, MD

## 2020-09-25 LAB — CERVICOVAGINAL ANCILLARY ONLY
Bacterial Vaginitis (gardnerella): NEGATIVE
Candida Glabrata: NEGATIVE
Candida Vaginitis: POSITIVE — AB
Chlamydia: NEGATIVE
Comment: NEGATIVE
Comment: NEGATIVE
Comment: NEGATIVE
Comment: NEGATIVE
Comment: NEGATIVE
Comment: NORMAL
Neisseria Gonorrhea: NEGATIVE
Trichomonas: NEGATIVE

## 2020-09-29 ENCOUNTER — Other Ambulatory Visit: Payer: Self-pay

## 2020-09-29 MED ORDER — FLUCONAZOLE 150 MG PO TABS: 150.0000 mg | ORAL_TABLET | Freq: Once | ORAL | 0 refills | Status: AC

## 2020-09-29 NOTE — Progress Notes (Signed)
Spoke with patient, is aware and Rx sent to pharmacy.

## 2020-10-29 ENCOUNTER — Encounter: Payer: Self-pay | Admitting: Family Medicine

## 2020-10-29 ENCOUNTER — Ambulatory Visit: Payer: 59 | Admitting: Family Medicine

## 2020-10-29 ENCOUNTER — Other Ambulatory Visit: Payer: Self-pay

## 2020-10-29 VITALS — BP 138/98 | HR 76 | Temp 98.4°F | Wt 170.4 lb

## 2020-10-29 DIAGNOSIS — R030 Elevated blood-pressure reading, without diagnosis of hypertension: Secondary | ICD-10-CM | POA: Diagnosis not present

## 2020-10-29 DIAGNOSIS — L309 Dermatitis, unspecified: Secondary | ICD-10-CM | POA: Diagnosis not present

## 2020-10-29 DIAGNOSIS — R635 Abnormal weight gain: Secondary | ICD-10-CM | POA: Diagnosis not present

## 2020-10-29 MED ORDER — TRIAMCINOLONE ACETONIDE 0.1 % EX CREA: 1.0000 "application " | TOPICAL_CREAM | Freq: Two times a day (BID) | CUTANEOUS | 0 refills | Status: AC

## 2020-10-29 NOTE — Patient Instructions (Signed)
Rash, Adult  A rash is a change in the color of your skin. A rash can also change the way your skin feels. There are many different conditions and factors that can cause a rash. Follow these instructions at home: The goal of treatment is to stop the itching and keep the rash from spreading. Watch for any changes in your symptoms. Let your doctor know about them. Follow these instructions to help with your condition: Medicine Take or apply over-the-counter and prescription medicines only as told by your doctor. These may include medicines:  To treat red or swollen skin (corticosteroid creams).  To treat itching.  To treat an allergy (oral antihistamines).  To treat very bad symptoms (oral corticosteroids).   Skin care  Put cool cloths (compresses) on the affected areas.  Do not scratch or rub your skin.  Avoid covering the rash. Make sure that the rash is exposed to air as much as possible. Managing itching and discomfort  Avoid hot showers or baths. These can make itching worse. A cold shower may help.  Try taking a bath with: ? Epsom salts. You can get these at your local pharmacy or grocery store. Follow the instructions on the package. ? Baking soda. Pour a small amount into the bath as told by your doctor. ? Colloidal oatmeal. You can get this at your local pharmacy or grocery store. Follow the instructions on the package.  Try putting baking soda paste onto your skin. Stir water into baking soda until it gets like a paste.  Try putting on a lotion that relieves itchiness (calamine lotion).  Keep cool and out of the sun. Sweating and being hot can make itching worse. General instructions  Rest as needed.  Drink enough fluid to keep your pee (urine) pale yellow.  Wear loose-fitting clothing.  Avoid scented soaps, detergents, and perfumes. Use gentle soaps, detergents, perfumes, and other cosmetic products.  Avoid anything that causes your rash. Keep a journal to help  track what causes your rash. Write down: ? What you eat. ? What cosmetic products you use. ? What you drink. ? What you wear. This includes jewelry.  Keep all follow-up visits as told by your doctor. This is important.   Contact a doctor if:  You sweat at night.  You lose weight.  You pee (urinate) more than normal.  You pee less than normal, or you notice that your pee is a darker color than normal.  You feel weak.  You throw up (vomit).  Your skin or the whites of your eyes look yellow (jaundice).  Your skin: ? Tingles. ? Is numb.  Your rash: ? Does not go away after a few days. ? Gets worse.  You are: ? More thirsty than normal. ? More tired than normal.  You have: ? New symptoms. ? Pain in your belly (abdomen). ? A fever. ? Watery poop (diarrhea). Get help right away if:  You have a fever and your symptoms suddenly get worse.  You start to feel mixed up (confused).  You have a very bad headache or a stiff neck.  You have very bad joint pains or stiffness.  You have jerky movements that you cannot control (seizure).  Your rash covers all or most of your body. The rash may or may not be painful.  You have blisters that: ? Are on top of the rash. ? Grow larger. ? Grow together. ? Are painful. ? Are inside your nose or mouth.  You have   a rash that: ? Looks like purple pinprick-sized spots all over your body. ? Has a "bull's eye" or looks like a target. ? Is red and painful, causes your skin to peel, and is not from being in the sun too long. Summary  A rash is a change in the color of your skin. A rash can also change the way your skin feels.  The goal of treatment is to stop the itching and keep the rash from spreading.  Take or apply over-the-counter and prescription medicines only as told by your doctor.  Contact a doctor if you have new symptoms or symptoms that get worse.  Keep all follow-up visits as told by your doctor. This is  important. This information is not intended to replace advice given to you by your health care provider. Make sure you discuss any questions you have with your health care provider. Document Revised: 10/19/2018 Document Reviewed: 01/29/2018 Elsevier Patient Education  2021 Elsevier Inc.  

## 2020-10-29 NOTE — Progress Notes (Signed)
Subjective:    Patient ID: [name], female    DOB: 1966/01/02, 55 y.o.   MRN: [id number]  Chief Complaint  Patient presents with  . Rash    HPI Patient was seen today for acute concern.  Patient endorses pruritic rash on right neck x1 week.  Patient denies any changes in soaps, lotions, detergents.  Patient tried cortisone cream once then stopped using it.  Pt mentions occasional cramps in the right knee.  Has history of arthritis.  Patient mentions increased snacking, intake of sweets, sodas, tea, and energy drinks.  Eating mostly fast food.  Endorses gaining weight.  Past Medical History:  Diagnosis Date  . Fibroids     Allergies  Allergen Reactions  . Penicillins Hives    ROS General: Denies fever, chills, night sweats, changes in weight, changes in appetite HEENT: Denies headaches, ear pain, changes in vision, rhinorrhea, sore throat CV: Denies CP, palpitations, SOB, orthopnea Pulm: Denies SOB, cough, wheezing GI: Denies abdominal pain, nausea, vomiting, diarrhea, constipation GU: Denies dysuria, hematuria, frequency, vaginal discharge Msk: Denies muscle cramps, joint pains + occasional cramping in right knee Neuro: Denies weakness, numbness, tingling Skin: Denies bruising  +rash Psych: Denies depression, anxiety, hallucinations     Objective:    Blood pressure (!) 138/98, pulse 76, temperature 98.4 F (36.9 C), temperature source Oral, weight 170 lb 6.4 oz (77.3 kg), last menstrual period 06/13/2018, SpO2 95 %.  Gen. Pleasant, well-nourished, in no distress, normal affect  HEENT: [place]/AT, face symmetric, conjunctiva clear, no scleral icterus, PERRLA, EOMI, nares patent without drainage Lungs: no accessory muscle use, CTAB, no wheezes or rales Cardiovascular: RRR, no m/r/g, no peripheral edema Musculoskeletal: No deformities, no cyanosis or clubbing, normal tone Neuro:  A&Ox3, CN II-XII intact, normal gait Skin:  Warm, dry, intact.  Erythematous wheals on right  neck.   Wt Readings from Last 3 Encounters:  10/29/20 170 lb 6.4 oz (77.3 kg)  09/24/20 171 lb (77.6 kg)  01/02/20 161 lb (73 kg)    Lab Results  Component Value Date   WBC 6.3 10/02/2017   HGB 14.3 10/02/2017   HCT 42.6 10/02/2017   PLT 311.0 10/02/2017   GLUCOSE 94 10/02/2017   CHOL 142 12/06/2018   TRIG 67.0 12/06/2018   HDL 52.30 12/06/2018   LDLCALC 77 12/06/2018   NA 138 10/02/2017   K 4.2 10/02/2017   CL 104 10/02/2017   CREATININE 0.50 10/02/2017   BUN 11 10/02/2017   CO2 28 10/02/2017   TSH 2.55 12/06/2018   HGBA1C 5.6 09/24/2020    Assessment/Plan:  Dermatitis -Discussed causes including allergies, sensitivity to fragrance products such as soaps, lotions, etc. -Patient advised to keep area clean and dry, avoid scented lotions, soaps, detergents -Given handout  - Plan: triamcinolone cream (KENALOG) 0.1 %  Elevated blood-pressure reading without diagnosis of hypertension -Elevated -Lifestyle modifications including decreasing sodium intake and increase physical activity -Continue to monitor  Weight gain -Lifestyle modification strongly encouraged -Continue to monitor  F/u prn  Grier Mitts, MD

## 2020-11-23 ENCOUNTER — Ambulatory Visit: Payer: 59 | Admitting: Family Medicine

## 2020-12-14 ENCOUNTER — Other Ambulatory Visit: Payer: Self-pay

## 2020-12-14 ENCOUNTER — Ambulatory Visit: Payer: 59 | Admitting: Family Medicine

## 2020-12-14 ENCOUNTER — Encounter: Payer: Self-pay | Admitting: Family Medicine

## 2020-12-14 VITALS — BP 128/84 | HR 71 | Temp 98.2°F | Wt 171.0 lb

## 2020-12-14 DIAGNOSIS — F32A Depression, unspecified: Secondary | ICD-10-CM | POA: Diagnosis not present

## 2020-12-14 DIAGNOSIS — E6609 Other obesity due to excess calories: Secondary | ICD-10-CM | POA: Diagnosis not present

## 2020-12-14 DIAGNOSIS — Z6831 Body mass index (BMI) 31.0-31.9, adult: Secondary | ICD-10-CM | POA: Diagnosis not present

## 2020-12-14 DIAGNOSIS — M25561 Pain in right knee: Secondary | ICD-10-CM

## 2020-12-14 DIAGNOSIS — M1711 Unilateral primary osteoarthritis, right knee: Secondary | ICD-10-CM | POA: Diagnosis not present

## 2020-12-14 NOTE — Patient Instructions (Addendum)
Osteoarthritis  Osteoarthritis is a type of arthritis. It refers to joint pain or joint disease. Osteoarthritis affects tissue that covers the ends of bones in joints (cartilage). Cartilage acts as a cushion between the bones and helps them move smoothly. Osteoarthritis occurs when cartilage in the joints gets worn down. Osteoarthritis is sometimes called "wear and tear" arthritis. Osteoarthritis is the most common form of arthritis. It often occurs in older people. It is a condition that gets worse over time. The joints most often affected by this condition are in the fingers, toes, hips, knees, and spine, including the neck and lower back. What are the causes? This condition is caused by the wearing down of cartilage that covers the ends of bones. What increases the risk? The following factors may make you more likely to develop this condition:  Being age 50 or older.  Obesity.  Overuse of joints.  Past injury of a joint.  Past surgery on a joint.  Family history of osteoarthritis. What are the signs or symptoms? The main symptoms of this condition are pain, swelling, and stiffness in the joint. Other symptoms may include:  An enlarged joint.  More pain and further damage caused by small pieces of bone or cartilage that break off and float inside of the joint.  Small deposits of bone (osteophytes) that grow on the edges of the joint.  A grating or scraping feeling inside the joint when you move it.  Popping or creaking sounds when you move.  Difficulty walking or exercising.  An inability to grip items, twist your hand(s), or control the movements of your hands and fingers. How is this diagnosed? This condition may be diagnosed based on:  Your medical history.  A physical exam.  Your symptoms.  X-rays of the affected joint(s).  Blood tests to rule out other types of arthritis. How is this treated? There is no cure for this condition, but treatment can help control  pain and improve joint function. Treatment may include a combination of therapies, such as:  Pain relief techniques, such as: ? Applying heat and cold to the joint. ? Massage. ? A form of talk therapy called cognitive behavioral therapy (CBT). This therapy helps you set goals and follow up on the changes that you make.  Medicines for pain and inflammation. The medicines can be taken by mouth or applied to the skin. They include: ? NSAIDs, such as ibuprofen. ? Prescription medicines. ? Strong anti-inflammatory medicines (corticosteroids). ? Certain nutritional supplements.  A prescribed exercise program. You may work with a physical therapist.  Assistive devices, such as a brace, wrap, splint, specialized glove, or cane.  A weight control plan.  Surgery, such as: ? An osteotomy. This is done to reposition the bones and relieve pain or to remove loose pieces of bone and cartilage. ? Joint replacement surgery. You may need this surgery if you have advanced osteoarthritis. Follow these instructions at home: Activity  Rest your affected joints as told by your health care provider.  Exercise as told by your health care provider. He or she may recommend specific types of exercise, such as: ? Strengthening exercises. These are done to strengthen the muscles that support joints affected by arthritis. ? Aerobic activities. These are exercises, such as brisk walking or water aerobics, that increase your heart rate. ? Range-of-motion activities. These help your joints move more easily. ? Balance and agility exercises. Managing pain, stiffness, and swelling  If directed, apply heat to the affected area as often   as told by your health care provider. Use the heat source that your health care provider recommends, such as a moist heat pack or a heating pad. ? If you have a removable assistive device, remove it as told by your health care provider. ? Place a towel between your skin and the heat  source. If your health care provider tells you to keep the assistive device on while you apply heat, place a towel between the assistive device and the heat source. ? Leave the heat on for 20-30 minutes. ? Remove the heat if your skin turns bright red. This is especially important if you are unable to feel pain, heat, or cold. You may have a greater risk of getting burned.  If directed, put ice on the affected area. To do this: ? If you have a removable assistive device, remove it as told by your health care provider. ? Put ice in a plastic bag. ? Place a towel between your skin and the bag. If your health care provider tells you to keep the assistive device on during icing, place a towel between the assistive device and the bag. ? Leave the ice on for 20 minutes, 2-3 times a day. ? Move your fingers or toes often to reduce stiffness and swelling. ? Raise (elevate) the injured area above the level of your heart while you are sitting or lying down.      General instructions  Take over-the-counter and prescription medicines only as told by your health care provider.  Maintain a healthy weight. Follow instructions from your health care provider for weight control.  Do not use any products that contain nicotine or tobacco, such as cigarettes, e-cigarettes, and chewing tobacco. If you need help quitting, ask your health care provider.  Use assistive devices as told by your health care provider.  Keep all follow-up visits as told by your health care provider. This is important. Where to find more information  [employer] of Arthritis and Musculoskeletal and Skin Diseases: www.niams.SouthExposed.es  [employer] on Aging: http://kim-miller.com/  American College of Rheumatology: www.rheumatology.org Contact a health care provider if:  You have redness, swelling, or a feeling of warmth in a joint that gets worse.  You have a fever along with joint or muscle aches.  You develop a  rash.  You have trouble doing your normal activities. Get help right away if:  You have pain that gets worse and is not relieved by pain medicine. Summary  Osteoarthritis is a type of arthritis that affects tissue covering the ends of bones in joints (cartilage).  This condition is caused by the wearing down of cartilage that covers the ends of bones.  The main symptom of this condition is pain, swelling, and stiffness in the joint.  There is no cure for this condition, but treatment can help control pain and improve joint function. This information is not intended to replace advice given to you by your health care provider. Make sure you discuss any questions you have with your health care provider. Document Revised: 06/24/2019 Document Reviewed: 06/24/2019 Elsevier Patient Education  2021 [place].  Acute Knee Pain, Adult Many things can cause knee pain. Sometimes, knee pain is sudden (acute) and may be caused by damage, swelling, or irritation of the muscles and tissues that support your knee. The pain often goes away on its own with time and rest. If the pain does not go away, tests may be done to find out what is causing the pain.  Follow these instructions at home: If you have a knee sleeve or brace:  Wear the knee sleeve or brace as told by your doctor. Take it off only as told by your doctor.  Loosen it if your toes: ? Tingle. ? Become numb. ? Turn cold and blue.  Keep it clean.  If the knee sleeve or brace is not waterproof: ? Do not let it get wet. ? Cover it with a watertight covering when you take a bath or shower.   Activity  Rest your knee.  Do not do things that cause pain or make pain worse.  Avoid activities where both feet leave the ground at the same time (high-impact activities). Examples are running, jumping rope, and doing jumping jacks.  Work with a physical therapist to make a safe exercise program, as told by your doctor. Managing pain,  stiffness, and swelling  If told, put ice on the knee. To do this: ? If you have a removable knee sleeve or brace, take it off as told by your doctor. ? Put ice in a plastic bag. ? Place a towel between your skin and the bag. ? Leave the ice on for 20 minutes, 2-3 times a day. ? Take off the ice if your skin turns bright red. This is very important. If you cannot feel pain, heat, or cold, you have a greater risk of damage to the area.  If told, use an elastic bandage to put pressure (compression) on your injured knee.  Raise your knee above the level of your heart while you are sitting or lying down.  Sleep with a pillow under your knee.   General instructions  Take over-the-counter and prescription medicines only as told by your doctor.  Do not smoke or use any products that contain nicotine or tobacco. If you need help quitting, ask your doctor.  If you are overweight, work with your doctor and a food expert (dietitian) to set goals to lose weight. Being overweight can make your knee hurt more.  Watch for any changes in your symptoms.  Keep all follow-up visits. Contact a doctor if:  The knee pain does not stop.  The knee pain changes or gets worse.  You have a fever along with knee pain.  Your knee is red or feels warm when you touch it.  Your knee gives out or locks up. Get help right away if:  Your knee swells, and the swelling gets worse.  You cannot move your knee.  You have very bad knee pain that does not get better with pain medicine. Summary  Many things can cause knee pain. The pain often goes away on its own with time and rest.  Your doctor may do tests to find out the cause of the pain.  Watch for any changes in your symptoms. Relieve your pain with rest, medicines, light activity, and use of ice.  Get help right away if you cannot move your knee or your knee pain is very bad. This information is not intended to replace advice given to you by your  health care provider. Make sure you discuss any questions you have with your health care provider. Document Revised: 12/11/2019 Document Reviewed: 12/11/2019 Elsevier Patient Education  2021 [place].  http://APA.org/depression-guideline"> https://clinicalkey.com"> http://point-of-care.elsevierperformancemanager.com/skills/"> http://point-of-care.elsevierperformancemanager.com">  Managing Depression, Adult Depression is a mental health condition that affects your thoughts, feelings, and actions. Being diagnosed with depression can bring you relief if you did not know why you have felt or behaved a  certain way. It could also leave you feeling overwhelmed with uncertainty about your future. Preparing yourself to manage your symptoms can help you feel more positive about your future. How to manage lifestyle changes Managing stress Stress is your body's reaction to life changes and events, both good and bad. Stress can add to your feelings of depression. Learning to manage your stress can help lessen your feelings of depression. Try some of the following approaches to reducing your stress (stress reduction techniques):  Listen to music that you enjoy and that inspires you.  Try using a meditation app or take a meditation class.  Develop a practice that helps you connect with your spiritual self. Walk in nature, pray, or go to a place of worship.  Do some deep breathing. To do this, inhale slowly through your nose. Pause at the top of your inhale for a few seconds and then exhale slowly, letting your muscles relax.  Practice yoga to help relax and work your muscles. Choose a stress reduction technique that suits your lifestyle and personality. These techniques take time and practice to develop. Set aside 5-15 minutes a day to do them. Therapists can offer training in these techniques. Other things you can do to manage stress include:  Keeping a stress diary.  Knowing your limits and saying  no when you think something is too much.  Paying attention to how you react to certain situations. You may not be able to control everything, but you can change your reaction.  Adding humor to your life by watching funny films or TV shows.  Making time for activities that you enjoy and that relax you.   Medicines Medicines, such as antidepressants, are often a part of treatment for depression.  Talk with your pharmacist or health care provider about all the medicines, supplements, and herbal products that you take, their possible side effects, and what medicines and other products are safe to take together.  Make sure to report any side effects you may have to your health care provider. Relationships Your health care provider may suggest family therapy, couples therapy, or individual therapy as part of your treatment. How to recognize changes Everyone responds differently to treatment for depression. As you recover from depression, you may start to:  Have more interest in doing activities.  Feel less hopeless.  Have more energy.  Overeat less often, or have a better appetite.  Have better mental focus. It is important to recognize if your depression is not getting better or is getting worse. The symptoms you had in the beginning may return, such as:  Tiredness (fatigue) or low energy.  Eating too much or too little.  Sleeping too much or too little.  Feeling restless, agitated, or hopeless.  Trouble focusing or making decisions.  Unexplained physical complaints.  Feeling irritable, angry, or aggressive. If you or your family members notice these symptoms coming back, let your health care provider know right away. Follow these instructions at home: Activity  Try to get some form of exercise each day, such as walking, biking, swimming, or lifting weights.  Practice stress reduction techniques.  Engage your mind by taking a class or doing some volunteer work.    Lifestyle  Get the right amount and quality of sleep.  Cut down on using caffeine, tobacco, alcohol, and other potentially harmful substances.  Eat a healthy diet that includes plenty of vegetables, fruits, whole grains, low-fat dairy products, and lean protein. Do not eat a lot of foods that  are high in solid fats, added sugars, or salt (sodium). General instructions  Take over-the-counter and prescription medicines only as told by your health care provider.  Keep all follow-up visits as told by your health care provider. This is important. Where to find support Talking to others Friends and family members can be sources of support and guidance. Talk to trusted friends or family members about your condition. Explain your symptoms to them, and let them know that you are working with a health care provider to treat your depression. Tell friends and family members how they also can be helpful.   Finances  Find appropriate mental health providers that fit with your financial situation.  Talk with your health care provider about options to get reduced prices on your medicines. Where to find more information You can find support in your area from:  Anxiety and Depression Association of America (ADAA): www.adaa.org  Mental Health America: www.mentalhealthamerica.net  [employer] on Mental Illness: www.nami.org Contact a health care provider if:  You stop taking your antidepressant medicines, and you have any of these symptoms: ? Nausea. ? Headache. ? Light-headedness. ? Chills and body aches. ? Not being able to sleep (insomnia).  You or your friends and family think your depression is getting worse. Get help right away if:  You have thoughts of hurting yourself or others. If you ever feel like you may hurt yourself or others, or have thoughts about taking your own life, get help right away. Go to your nearest emergency department or:  Call your local emergency services  (911 in the [place]  Call a suicide crisis helpline, such as the [place] [facility] at 334 586 1347. This is open 24 hours a day in the [place]  Text the Crisis Text Line at 631-678-0835 (in the [place].). Summary  If you are diagnosed with depression, preparing yourself to manage your symptoms is a good way to feel positive about your future.  Work with your health care provider on a management plan that includes stress reduction techniques, medicines (if applicable), therapy, and healthy lifestyle habits.  Keep talking with your health care provider about how your treatment is working.  If you have thoughts about taking your own life, call a suicide crisis helpline or text a crisis text line. This information is not intended to replace advice given to you by your health care provider. Make sure you discuss any questions you have with your health care provider. Document Revised: 05/08/2019 Document Reviewed: 05/08/2019 Elsevier Patient Education  2021 [employer].

## 2020-12-14 NOTE — Progress Notes (Signed)
Subjective:    Patient ID: [name], female    DOB: 11-Dec-1965, 55 y.o.   MRN: [id number]  Chief Complaint  Patient presents with  . Knee Pain    Rt knee pain since Friday, took tylenol pain and arthritis once for pain. Pain feels achy, and tight behind the knee. Has been told has arthritis in knee.     HPI Patient was seen today for acute concern.  Patient endorses right knee pain starting Friday.  Patient does not recall any injury or increased activity that may have caused symptoms.  Patient notes mild tightness behind right knee.  Tried Tylenol for symptoms.  Patient notes weight gain.  Endorses eating more "junk", drinking more soda, not drinking water.  Patient states she is increased her snacking for the last few years since her sister died.  Patient endorses feeling depressed at times.  Past Medical History:  Diagnosis Date  . Fibroids     Allergies  Allergen Reactions  . Penicillins Hives    ROS General: Denies fever, chills, night sweats, changes in weight, changes in appetite HEENT: Denies headaches, ear pain, changes in vision, rhinorrhea, sore throat CV: Denies CP, palpitations, SOB, orthopnea Pulm: Denies SOB, cough, wheezing GI: Denies abdominal pain, nausea, vomiting, diarrhea, constipation GU: Denies dysuria, hematuria, frequency, vaginal discharge Msk: Denies muscle cramps, joint pains  + right knee pain Neuro: Denies weakness, numbness, tingling Skin: Denies rashes, bruising Psych: Denies depression, anxiety, hallucinations     Objective:    Blood pressure 128/84, pulse 71, temperature 98.2 F (36.8 C), temperature source Oral, weight 171 lb (77.6 kg), last menstrual period 06/13/2018, SpO2 98 %.  Gen. Pleasant, well-nourished, in no distress, normal affect   HEENT: [place]/AT, face symmetric, conjunctiva clear, no scleral icterus, PERRLA, EOMI, nares patent without drainage Lungs: no accessory muscle use, CTAB, no wheezes or rales Cardiovascular: RRR, no  m/r/g, no peripheral edema Musculoskeletal: Right knee with mild crepitus, TTP of medial joint line, subtle fullness in the right popliteal fossa, no effusion.  Lleft knee normal without TTP or crepitus.  No deformities, no cyanosis or clubbing, normal tone Neuro:  A&Ox3, CN II-XII intact, normal gait Skin:  Warm, no lesions/ rash   Wt Readings from Last 3 Encounters:  12/14/20 171 lb (77.6 kg)  10/29/20 170 lb 6.4 oz (77.3 kg)  09/24/20 171 lb (77.6 kg)    Lab Results  Component Value Date   WBC 6.3 10/02/2017   HGB 14.3 10/02/2017   HCT 42.6 10/02/2017   PLT 311.0 10/02/2017   GLUCOSE 94 10/02/2017   CHOL 142 12/06/2018   TRIG 67.0 12/06/2018   HDL 52.30 12/06/2018   LDLCALC 77 12/06/2018   NA 138 10/02/2017   K 4.2 10/02/2017   CL 104 10/02/2017   CREATININE 0.50 10/02/2017   BUN 11 10/02/2017   CO2 28 10/02/2017   TSH 2.55 12/06/2018   HGBA1C 5.6 09/24/2020    Assessment/Plan:  Primary osteoarthritis of right knee -No effusion noted on exam -Mild OA with medial compartment space narrowing and peripheral spurring noted on knee x-ray from 01/10/2020 -Discussed supportive care including ice, heat, stretching, rest, compression, topical analgesics such as Voltaren gel or Biofreeze -Okay to continue Tylenol arthritis strength -For continued or worsening symptoms consider PT/referral to Ortho  Acute pain of right knee -Likely 2/2 OA  Class 1 obesity due to excess calories with body mass index (BMI) of 31.0 to 31.9 in adult, unspecified whether serious comorbidity present -Lifestyle modification strongly  encouraged -Patient encouraged to decrease soda intake, increase p.o. intake of water -Patient encouraged to increase physical activity  Depression, unspecified depression type -despite PHQ 9 score 0 and GAD 7 score 0, pt has mentioned at several OFVs feeling depressed, increased appetite, decreased motivation. -Discussed medication and counseling options -Given info  about area [facility] providers.  Patient encouraged to schedule appointment -We will continue to monitor and require about a each visit  F/u as needed  Grier Mitts, MD

## 2020-12-21 ENCOUNTER — Ambulatory Visit: Payer: 59

## 2020-12-25 ENCOUNTER — Ambulatory Visit: Payer: 59 | Admitting: Family Medicine

## 2020-12-28 DIAGNOSIS — Z01419 Encounter for gynecological examination (general) (routine) without abnormal findings: Secondary | ICD-10-CM | POA: Diagnosis not present

## 2021-01-13 ENCOUNTER — Ambulatory Visit: Payer: 59 | Admitting: Family Medicine

## 2021-01-19 ENCOUNTER — Other Ambulatory Visit: Payer: Self-pay

## 2021-01-20 ENCOUNTER — Encounter: Payer: Self-pay | Admitting: Family Medicine

## 2021-01-20 ENCOUNTER — Ambulatory Visit: Payer: 59 | Admitting: Family Medicine

## 2021-01-20 VITALS — BP 122/88 | HR 65 | Temp 98.0°F | Wt 172.8 lb

## 2021-01-20 DIAGNOSIS — M1711 Unilateral primary osteoarthritis, right knee: Secondary | ICD-10-CM | POA: Diagnosis not present

## 2021-01-20 DIAGNOSIS — N951 Menopausal and female climacteric states: Secondary | ICD-10-CM | POA: Diagnosis not present

## 2021-01-20 DIAGNOSIS — R635 Abnormal weight gain: Secondary | ICD-10-CM | POA: Diagnosis not present

## 2021-01-20 DIAGNOSIS — R030 Elevated blood-pressure reading, without diagnosis of hypertension: Secondary | ICD-10-CM | POA: Diagnosis not present

## 2021-01-20 NOTE — Progress Notes (Signed)
Subjective:    Patient ID: [name], female    DOB: [birth year], 55 y.o.   MRN: [id number]  No chief complaint on file.   HPI Patient was seen today for f/u.  Had recent well woman exam with OB/Gyn.  Having hot flashes.  Craving sugar (Cakes, candy, etc) which will cause a hot flash after eating it.  Drinking 2 sodas, 2-3 xtra large cups of sweet tea from [employer], and some water.  Also putting flavor packs in water.  R knee pain improved since being on vacation.  Not hurting as much at work.   Notes some wt gain.  Joined the gym at the [facility] and is to start working out with a Clinical research associate. Pt's mom has HTN, DM, and CKD 4 about to start HD.  Her sister has DM.  Mammogram scheduled 01/26/2021.  Past Medical History:  Diagnosis Date   Fibroids     Allergies  Allergen Reactions   Penicillins Hives   Family History  Problem Relation Age of Onset   Diabetes Mother    Hypertension Mother    Diabetes Father    Hypertension Father     ROS General: Denies fever, chills, night sweats, changes in appetite  +weight gain HEENT: Denies headaches, ear pain, changes in vision, rhinorrhea, sore throat CV: Denies CP, palpitations, SOB, orthopnea Pulm: Denies SOB, cough, wheezing GI: Denies abdominal pain, nausea, vomiting, diarrhea, constipation GU: Denies dysuria, hematuria, frequency, vaginal discharge Msk: Denies muscle cramps, joint pains  Neuro: Denies weakness, numbness, tingling Skin: Denies rashes, bruising Psych: Denies depression, anxiety, hallucinations  Objective:    Blood pressure 122/88, pulse 65, temperature 98 F (36.7 C), temperature source Oral, weight 172 lb 12.8 oz (78.4 kg), last menstrual period 06/13/2018, SpO2 98 %.   Gen. Pleasant, well-nourished, in no distress, normal affect   HEENT: [place]/AT, face symmetric, conjunctiva clear, no scleral icterus, PERRLA, EOMI, nares patent without drainage Lungs: no accessory muscle use, CTAB, no wheezes or  rales Cardiovascular: RRR, no m/r/g, no peripheral edema Musculoskeletal: No effusions, no erythema, no deformities, no cyanosis or clubbing, normal tone Neuro:  A&Ox3, CN II-XII intact, normal gait Skin:  Warm, no lesions/ rash   Wt Readings from Last 3 Encounters:  01/20/21 172 lb 12.8 oz (78.4 kg)  12/14/20 171 lb (77.6 kg)  10/29/20 170 lb 6.4 oz (77.3 kg)    Lab Results  Component Value Date   WBC 6.3 10/02/2017   HGB 14.3 10/02/2017   HCT 42.6 10/02/2017   PLT 311.0 10/02/2017   GLUCOSE 94 10/02/2017   CHOL 142 12/06/2018   TRIG 67.0 12/06/2018   HDL 52.30 12/06/2018   LDLCALC 77 12/06/2018   NA 138 10/02/2017   K 4.2 10/02/2017   CL 104 10/02/2017   CREATININE 0.50 10/02/2017   BUN 11 10/02/2017   CO2 28 10/02/2017   TSH 2.55 12/06/2018   HGBA1C 5.6 09/24/2020    Assessment/Plan:  Primary osteoarthritis of right knee -stable -mild OA with medial compartment joint space narrowing and peripheral spurring noted on Xray 01/10/20 -Discussed supportive care including ice, heat, compression, supportive shoes, topical analgesics, rest when able  Elevated blood-pressure reading without diagnosis of hypertension -Repeat 122/88 -Lifestyle modification strongly encouraged -Continue to monitor  Weight gain -Likely multifactorial 2/2 menopause and increased caloric intake -Discussed the importance of healthy snacks and decreasing sugar intake -Advised to substitute 1-2 sugary drinks for plain water throughout the day  Hot flashes due to menopause -Discussed various options to  help treat symptoms -Continue follow-up with OB/GYN  F/u in the next few wks for CPE.  Grier Mitts, MD

## 2021-01-26 ENCOUNTER — Ambulatory Visit
Admission: RE | Admit: 2021-01-26 | Discharge: 2021-01-26 | Disposition: A | Discharge status: Home / Self Care | Payer: 59 | Source: Ambulatory Visit | Attending: Obstetrics and Gynecology | Admitting: Obstetrics and Gynecology

## 2021-01-26 ENCOUNTER — Other Ambulatory Visit: Payer: Self-pay

## 2021-01-26 DIAGNOSIS — Z1231 Encounter for screening mammogram for malignant neoplasm of breast: Secondary | ICD-10-CM

## 2021-02-01 ENCOUNTER — Other Ambulatory Visit: Payer: 59

## 2021-02-22 ENCOUNTER — Encounter: Payer: 59 | Admitting: Family Medicine

## 2021-03-25 ENCOUNTER — Encounter: Payer: 59 | Admitting: Family Medicine

## 2021-04-14 ENCOUNTER — Other Ambulatory Visit: Payer: Self-pay

## 2021-04-15 ENCOUNTER — Encounter: Payer: Self-pay | Admitting: Family Medicine

## 2021-04-15 ENCOUNTER — Ambulatory Visit (INDEPENDENT_AMBULATORY_CARE_PROVIDER_SITE_OTHER): Payer: 59 | Admitting: Family Medicine

## 2021-04-15 VITALS — BP 140/92 | HR 62 | Temp 98.4°F | Ht 61.0 in | Wt 169.6 lb

## 2021-04-15 DIAGNOSIS — Z0001 Encounter for general adult medical examination with abnormal findings: Secondary | ICD-10-CM | POA: Diagnosis not present

## 2021-04-15 DIAGNOSIS — Z0000 Encounter for general adult medical examination without abnormal findings: Secondary | ICD-10-CM

## 2021-04-15 DIAGNOSIS — M2141 Flat foot [pes planus] (acquired), right foot: Secondary | ICD-10-CM | POA: Diagnosis not present

## 2021-04-15 DIAGNOSIS — Z1159 Encounter for screening for other viral diseases: Secondary | ICD-10-CM | POA: Diagnosis not present

## 2021-04-15 DIAGNOSIS — M2142 Flat foot [pes planus] (acquired), left foot: Secondary | ICD-10-CM | POA: Insufficient documentation

## 2021-04-15 DIAGNOSIS — M21611 Bunion of right foot: Secondary | ICD-10-CM | POA: Diagnosis not present

## 2021-04-15 DIAGNOSIS — I10 Essential (primary) hypertension: Secondary | ICD-10-CM | POA: Diagnosis not present

## 2021-04-15 DIAGNOSIS — M1711 Unilateral primary osteoarthritis, right knee: Secondary | ICD-10-CM | POA: Diagnosis not present

## 2021-04-15 DIAGNOSIS — Z1211 Encounter for screening for malignant neoplasm of colon: Secondary | ICD-10-CM

## 2021-04-15 DIAGNOSIS — Z131 Encounter for screening for diabetes mellitus: Secondary | ICD-10-CM

## 2021-04-15 DIAGNOSIS — Z13220 Encounter for screening for lipoid disorders: Secondary | ICD-10-CM

## 2021-04-15 LAB — COMPREHENSIVE METABOLIC PANEL
ALT: 28 U/L (ref 0–35)
AST: 25 U/L (ref 0–37)
Albumin: 4.4 g/dL (ref 3.5–5.2)
Alkaline Phosphatase: 112 U/L (ref 39–117)
BUN: 16 mg/dL (ref 6–23)
CO2: 27 mEq/L (ref 19–32)
Calcium: 9.6 mg/dL (ref 8.4–10.5)
Chloride: 105 mEq/L (ref 96–112)
Creatinine, Ser: 0.55 mg/dL (ref 0.40–1.20)
GFR: 102.34 mL/min (ref >60.00)
Glucose, Bld: 92 mg/dL (ref 70–99)
Potassium: 4.2 mEq/L (ref 3.5–5.1)
Sodium: 138 mEq/L (ref 135–145)
Total Bilirubin: 0.3 mg/dL (ref 0.2–1.2)
Total Protein: 7.7 g/dL (ref 6.0–8.3)

## 2021-04-15 LAB — CBC WITH DIFFERENTIAL/PLATELET
Basophils Absolute: 0.1 10*3/uL (ref 0.0–0.1)
Basophils Relative: 1.2 % (ref 0.0–3.0)
Eosinophils Absolute: 0.1 10*3/uL (ref 0.0–0.7)
Eosinophils Relative: 1.6 % (ref 0.0–5.0)
HCT: 42.1 % (ref 36.0–46.0)
Hemoglobin: 13.8 g/dL (ref 12.0–15.0)
Lymphocytes Relative: 32.6 % (ref 12.0–46.0)
Lymphs Abs: 1.9 10*3/uL (ref 0.7–4.0)
MCHC: 32.9 g/dL (ref 30.0–36.0)
MCV: 84.9 fl (ref 78.0–100.0)
Monocytes Absolute: 0.3 10*3/uL (ref 0.1–1.0)
Monocytes Relative: 5.6 % (ref 3.0–12.0)
Neutro Abs: 3.5 10*3/uL (ref 1.4–7.7)
Neutrophils Relative %: 59 % (ref 43.0–77.0)
Platelets: 306 10*3/uL (ref 150.0–400.0)
RBC: 4.95 Mil/uL (ref 3.87–5.11)
RDW: 14.3 % (ref 11.5–15.5)
WBC: 5.9 10*3/uL (ref 4.0–10.5)

## 2021-04-15 LAB — LIPID PANEL
Cholesterol: 136 mg/dL (ref 0–200)
HDL: 53.1 mg/dL (ref >39.00)
LDL Cholesterol: 71 mg/dL (ref 0–99)
NonHDL: 83.05
Total CHOL/HDL Ratio: 3
Triglycerides: 62 mg/dL (ref 0.0–149.0)
VLDL: 12.4 mg/dL (ref 0.0–40.0)

## 2021-04-15 LAB — VITAMIN D 25 HYDROXY (VIT D DEFICIENCY, FRACTURES): VITD: 13.23 ng/mL — ABNORMAL LOW (ref 30.00–100.00)

## 2021-04-15 LAB — HEMOGLOBIN A1C: Hgb A1c MFr Bld: 5.8 % (ref 4.6–6.5)

## 2021-04-15 LAB — T4, FREE: Free T4: 0.67 ng/dL (ref 0.60–1.60)

## 2021-04-15 LAB — TSH: TSH: 1.63 u[IU]/mL (ref 0.35–5.50)

## 2021-04-15 MED ORDER — HYDROCHLOROTHIAZIDE 12.5 MG PO CAPS
12.5000 mg | ORAL_CAPSULE | Freq: Every day | ORAL | 2 refills | Status: DC
  Filled 2021-05-12: qty 30, 30d supply, fill #0

## 2021-04-15 NOTE — Progress Notes (Signed)
Subjective:     [name] is a 55 y.o. female and is here for a comprehensive physical exam. The patient reports arch in feet seems lower.  Wearing flat shoes with little to no support.  Endorses intermittent R knee pain, h/o OA.  Pt endorses loving salty foods such as pork rinds. Endorses family history of HTN and DM.  Followed by OB/Gyn, had Well women exam a few months ago, but would like labs done.  Had influenza vaccine a few wks ago.  Advised bp again elevated.  Pt states this is not what she came in for.  Social History   Socioeconomic History   Marital status: Single    Spouse name: Not on file   Number of children: Not on file   Years of education: Not on file   Highest education level: Not on file  Occupational History   Not on file  Tobacco Use   Smoking status: Former   Smokeless tobacco: Never  Vaping Use   Vaping Use: Never used  Substance and Sexual Activity   Alcohol use: Yes    Comment: occass.    Drug use: No   Sexual activity: Yes  Other Topics Concern   Not on file  Social History Narrative   Not on file   Social Determinants of Health   Financial Resource Strain: Not on file  Food Insecurity: Not on file  Transportation Needs: Not on file  Physical Activity: Not on file  Stress: Not on file  Social Connections: Not on file  Intimate Partner Violence: Not on file   Health Maintenance  Topic Date Due   HIV Screening  Never done   Hepatitis C Screening  Never done   Zoster Vaccines- Shingrix (1 of 2) Never done   COLONOSCOPY (Pts 45-76yrs Insurance coverage will need to be confirmed)  Never done   COVID-19 Vaccine (3 - Mixed Product risk series) 11/29/2019   PAP SMEAR-Modifier  12/06/2021   MAMMOGRAM  01/27/2023   TETANUS/TDAP  01/08/2028   INFLUENZA VACCINE  Completed   HPV VACCINES  Aged Out    The following portions of the patient's history were reviewed and updated as appropriate: allergies, current medications, past family history,  past medical history, past social history, past surgical history, and problem list.  Review of Systems Pertinent items noted in HPI and remainder of comprehensive ROS otherwise negative.   Objective:    BP (!) 140/92 (BP Location: Left Arm, Patient Position: Sitting, Cuff Size: Normal)   Pulse 62   Temp 98.4 F (36.9 C) (Oral)   Ht 5\' 1"  (1.549 m)   Wt 169 lb 9.6 oz (76.9 kg)   LMP 06/13/2018   SpO2 99%   BMI 32.05 kg/m  General appearance: alert, cooperative, and no distress Head: Normocephalic, without obvious abnormality, atraumatic Eyes: conjunctivae/corneas clear. PERRL, EOM's intact. Fundi benign. Ears: normal TM's and external ear canals both ears Nose: Nares normal. Septum midline. Mucosa normal. No drainage or sinus tenderness. Throat: lips, mucosa, and tongue normal; teeth and gums normal Neck: no adenopathy, no carotid bruit, no JVD, supple, symmetrical, trachea midline, and thyroid not enlarged, symmetric, no tenderness/mass/nodules Lungs: clear to auscultation bilaterally Heart: regular rate and rhythm, S1, S2 normal, no murmur, click, rub or gallop Abdomen: soft, non-tender; bowel sounds normal; no masses,  no organomegaly Extremities: Bunion R great toe.  B/l flat feet.  R knee slightly larger than L.  No effusion, crepitus, or erythema.  Extremities normal, atraumatic, no  cyanosis or edema Pulses: 2+ and symmetric Skin: Skin color, texture, turgor normal. No rashes or lesions Lymph nodes: Cervical, supraclavicular, and axillary nodes normal. Neurologic: Alert and oriented X 3, normal strength and tone. Normal symmetric reflexes. Normal coordination and gait    Assessment:    Healthy female exam with HTN.     Plan:    Anticipatory guidance given including wearing seatbelts, smoke detectors in the home, increasing physical activity, increasing p.o. intake of water and vegetables. -labs -pap up to date, done with OB/Gyn -advised to consider colonoscopy.   -Mammogram done 01/26/21 -given handout -next CPE in 1 yr See After Visit Summary for Counseling Recommendations   Essential hypertension  -uncontrolled -discussed the importance of lifestyle modifications -Advised to start HCTZ 12.5 mg daily. Pt initially declines as upset and in denial about dx. -f/u in 1 month - Plan: CMP, hydrochlorothiazide (MICROZIDE) 12.5 MG capsule  Bunion, right foot -advised on treatment options -avoiding wearing shoes that are too tight -given handout -Podiatry referral if needed  Flat feet, bilateral -Advised to obtain OTC arch supports/insoles -avoid wearing flat shoes -given handout  Primary osteoarthritis of right knee  - Plan: CBC with Differential/Platelet  Screening for cholesterol level  - Plan: Lipid panel  Screening for diabetes mellitus  - Plan: Hemoglobin A1c  Encounter for hepatitis C screening test for low risk patient  - Plan: Hep C Antibody  Colon cancer screening  - Plan: Ambulatory referral to Gastroenterology  F/u in 1 month for HTN  Grier Mitts, MD

## 2021-04-15 NOTE — Patient Instructions (Signed)
A prescription for hydrochlorothiazide 12.5 mg was sent to your pharmacy.  This is a medication to help decrease your blood pressure.  It will also be important for you to cut down on the amount of salt that you are eating and increase your intake of water.  While taking this medication you might notice that you are urinating more frequently.  We will have you follow back up in 1 month to see how your blood pressure is doing with the medication.  You start checking your blood pressure at home and keeping a log to bring with you to clinic.

## 2021-04-16 ENCOUNTER — Other Ambulatory Visit: Payer: Self-pay | Admitting: Family Medicine

## 2021-04-16 DIAGNOSIS — E559 Vitamin D deficiency, unspecified: Secondary | ICD-10-CM

## 2021-04-16 LAB — HEPATITIS C ANTIBODY
Hepatitis C Ab: NONREACTIVE
SIGNAL TO CUT-OFF: 0.03 (ref <1.00)

## 2021-04-16 MED ORDER — VITAMIN D (ERGOCALCIFEROL) 1.25 MG (50000 UNIT) PO CAPS: 50000.0000 [IU] | ORAL_CAPSULE | ORAL | 0 refills | Status: DC

## 2021-04-20 ENCOUNTER — Telehealth: Payer: Self-pay | Admitting: Family Medicine

## 2021-04-20 NOTE — Telephone Encounter (Signed)
Pt call and stated she want a call back to talk bp pills that dr.Banks prescribe for her

## 2021-04-22 ENCOUNTER — Telehealth: Payer: Self-pay | Admitting: Family Medicine

## 2021-04-22 NOTE — Telephone Encounter (Signed)
Pt is calling and would like discuss Vit D

## 2021-04-23 NOTE — Telephone Encounter (Signed)
Spoke with patient about vitamin D, wanted to know how and when to take it because the bottle states one pill every 7 days. Informed her that is correct, she will take one pill by mouth every 7 days (1 pill weekly) for 12 weeks. She stated she only got 4 pills from pharmacy, I advised her we sent Rx for 12 pills ad I could not answer why they only gave 4. She then stated the bottle also sates it is a partial prescription, informed her they could have limited at the pharmacy, or it could be insurance reasons. She said she would call and ask the pharmacy.

## 2021-05-03 ENCOUNTER — Encounter: Payer: Self-pay | Admitting: *Deleted

## 2021-05-03 ENCOUNTER — Telehealth (INDEPENDENT_AMBULATORY_CARE_PROVIDER_SITE_OTHER): Payer: 59 | Admitting: Family Medicine

## 2021-05-03 ENCOUNTER — Other Ambulatory Visit: Payer: Self-pay

## 2021-05-03 ENCOUNTER — Telehealth: Payer: Self-pay

## 2021-05-03 ENCOUNTER — Encounter: Payer: Self-pay | Admitting: Family Medicine

## 2021-05-03 ENCOUNTER — Telehealth: Payer: Self-pay | Admitting: Family Medicine

## 2021-05-03 DIAGNOSIS — J069 Acute upper respiratory infection, unspecified: Secondary | ICD-10-CM | POA: Diagnosis not present

## 2021-05-03 MED ORDER — BENZONATATE 100 MG PO CAPS: 100.0000 mg | ORAL_CAPSULE | Freq: Three times a day (TID) | ORAL | 2 refills | Status: DC | PRN

## 2021-05-03 NOTE — Progress Notes (Deleted)
Virtual Visit via Video Note  I connected with [name]  on 05/03/21 at 10:30 AM EDT by a video enabled telemedicine application and verified that I am speaking with the correct person using two identifiers.  Location patient: home Location provider: [place], [place] [address] Persons participating in the virtual visit: patient, provider  I discussed the limitations of evaluation and management by telemedicine and the availability of in person appointments. The patient expressed understanding and agreed to proceed.   [name] DOB: Nov 08, 1965 Encounter date: 05/03/2021  This is a 55 y.o. female who presents with Chief Complaint  Patient presents with   Sinus Problem    Patient complains of sinus congestion and drainage x3 days, tried Coricidin once    Sore Throat    X2 days, states the home Covid test was negative 2 days    History of present illness:   Allergies  Allergen Reactions   Penicillins Hives   Current Meds  Medication Sig   benzonatate (TESSALON PERLES) 100 MG capsule Take 1 capsule (100 mg total) by mouth 3 (three) times daily as needed for cough.   hydrochlorothiazide (MICROZIDE) 12.5 MG capsule Take 1 capsule (12.5 mg total) by mouth daily.   Vitamin D, Ergocalciferol, (DRISDOL) 1.25 MG (50000 UNIT) CAPS capsule Take 1 capsule (50,000 Units total) by mouth every 7 (seven) days.    Review of Systems  Constitutional:  Negative for chills and fever.  HENT:  Positive for congestion, postnasal drip and sore throat. Negative for ear pain, sinus pressure and sinus pain.   Respiratory:  Positive for cough. Negative for shortness of breath and wheezing.   Cardiovascular:  Negative for chest pain.   Objective:  LMP 06/13/2018       BP Readings from Last 3 Encounters:  04/15/21 (!) 140/92  01/20/21 122/88  12/14/20 128/84   Wt Readings from Last 3 Encounters:  04/15/21 169 lb 9.6 oz (76.9 kg)  01/20/21 172 lb  12.8 oz (78.4 kg)  12/14/20 171 lb (77.6 kg)    EXAM:  GENERAL: alert, oriented, appears well and in no acute distress  HEENT: atraumatic, conjunctiva clear, no obvious abnormalities on inspection of external nose and ears  NECK: normal movements of the head and neck  LUNGS: on inspection no signs of respiratory distress, breathing rate appears normal, no obvious gross SOB, gasping or wheezing  CV: no obvious cyanosis  MS: moves all visible extremities without noticeable abnormality  PSYCH/NEURO: pleasant and cooperative, no obvious depression or anxiety, speech and thought processing grossly intact   Assessment/Plan  There are no diagnoses linked to this encounter.     I discussed the assessment and treatment plan with the patient. The patient was provided an opportunity to ask questions and all were answered. The patient agreed with the plan and demonstrated an understanding of the instructions.   The patient was advised to call back or seek an in-person evaluation if the symptoms worsen or if the condition fails to improve as anticipated.  I provided *** minutes of face-to-face time during this encounter.   Micheline Rough, MD

## 2021-05-03 NOTE — Progress Notes (Signed)
Virtual Visit via Telephone Note  I connected with [name]  on 05/03/21 at 10:30 AM EDT by telephone and verified that I am speaking with the correct person using two identifiers.   I discussed the limitations, risks, security and privacy concerns of performing an evaluation and management service by telephone and the availability of in person appointments. I also discussed with the patient that there may be a patient responsible charge related to this service. The patient expressed understanding and agreed to proceed.  Location patient: home Location provider:  [facility]  [place], [place] [address]  Participants present for the call: patient, provider Patient did not have a visit in the prior 7 days to address this/these issue(s).   History of Present Illness: Took coricidin just one time, Saturday night - not sure if it helped. Not having sinus pain, pressure, but post nasal drainage. She is coughing. Coughing up phlegm with cough. Last night felt like she was wheezing - just heard something. Not hard to breathe.   No headache.   Sore throat today feeling better. Not as sore. Just hurts when she coughs. Voice started going yesterday morning.   Has had sinus infections in past; none in a couple of years.   Mom was sick.   She hasn't had fevers that she knows of. No ear pain. Tinge of blood in phlegm when she coughed yesterday. Has not had blood from nasal drainage.   Does have seasonal allergies, but doesn't take anything for those.   Doing ok with keeping up with fluids, eating. No change in appetite.   Feels overall better today then 48 hours ago.    Observations/Objective: Patient sounds congested, occasional cough, hoarse voice on the phone.  I do not appreciate any SOB. Speech and thought processing are grossly intact. Patient reported vitals: none taken.  Assessment and Plan: 1. Viral URI Negative home covid test, sx improving at  this point. Suspect viral illness. Ok to continue with coricidin or just start regular antihistamine. Suggest tessalon perles to help with cough induced from drainage. Call if any worsening of sx or if not noting some improvement on Friday.    Follow Up Instructions: PRN   [id number] [id number] [id number] 11-20 [id number] 21-30 I did not refer this patient for an OV in the next 24 hours for this/these issue(s).  I discussed the assessment and treatment plan with the patient. The patient was provided an opportunity to ask questions and all were answered. The patient agreed with the plan and demonstrated an understanding of the instructions.   The patient was advised to call back or seek an in-person evaluation if the symptoms worsen or if the condition fails to improve as anticipated.  I provided 15 minutes of non-face-to-face time during this encounter.   Micheline Rough, MD

## 2021-05-03 NOTE — Telephone Encounter (Signed)
Patient is returning call to the office.  Called after hours line on 04/30/21 at 5:19 pm.

## 2021-05-03 NOTE — Telephone Encounter (Signed)
Patient called asking for doctor's note and would like to pick up tomorrow patient would like a call when ready for pick up.

## 2021-05-03 NOTE — Telephone Encounter (Signed)
Spoke with the patient and she requested a note to be out of work until Wednesday.  Patient is aware this was approved by Dr Ethlyn Gallery and a work note was left at the front desk for pick up.

## 2021-05-04 NOTE — Telephone Encounter (Signed)
Spoke with pt, stated she was making sure it was an old message I had left her. Informed her it was, and that I spoke with her shortly after the VM was left.

## 2021-05-06 ENCOUNTER — Telehealth: Payer: Self-pay | Admitting: Family Medicine

## 2021-05-06 NOTE — Telephone Encounter (Signed)
Pt is calling and she was reading on the news there is a recall on HCTZ. Please advise [facility] [place], [place] [phone]

## 2021-05-12 ENCOUNTER — Other Ambulatory Visit (HOSPITAL_COMMUNITY): Payer: Self-pay

## 2021-05-17 ENCOUNTER — Ambulatory Visit: Payer: 59 | Admitting: Family Medicine

## 2021-05-18 ENCOUNTER — Other Ambulatory Visit: Payer: Self-pay

## 2021-05-18 NOTE — Telephone Encounter (Signed)
Has appt with pcp tomorrow

## 2021-05-19 ENCOUNTER — Encounter: Payer: Self-pay | Admitting: Family Medicine

## 2021-05-19 ENCOUNTER — Ambulatory Visit: Payer: 59 | Admitting: Family Medicine

## 2021-05-19 ENCOUNTER — Other Ambulatory Visit (HOSPITAL_COMMUNITY): Payer: Self-pay

## 2021-05-19 VITALS — BP 128/88 | HR 70 | Temp 98.4°F | Wt 165.6 lb

## 2021-05-19 DIAGNOSIS — I10 Essential (primary) hypertension: Secondary | ICD-10-CM | POA: Diagnosis not present

## 2021-05-19 DIAGNOSIS — R7303 Prediabetes: Secondary | ICD-10-CM

## 2021-05-19 MED ORDER — HYDROCHLOROTHIAZIDE 12.5 MG PO CAPS
12.5000 mg | ORAL_CAPSULE | Freq: Every day | ORAL | 2 refills | Status: DC
  Filled 2021-05-19 – 2021-06-07 (×2): qty 90, 90d supply, fill #0
  Filled 2021-08-25: qty 90, 90d supply, fill #1
  Filled 2021-11-02 – 2021-11-17 (×3): qty 90, 90d supply, fill #2

## 2021-05-19 NOTE — Patient Instructions (Signed)
It is important that you obtain a blood pressure cuff to monitor your blood pressure at home.  You should continue taking hydrochlorothiazide daily, increasing your physical activity, and drinking more water.  Is important that you decrease the amount of sweets and carbohydrates you are eating after labs last visit were in the prediabetic range.

## 2021-05-19 NOTE — Progress Notes (Signed)
Subjective:    Patient ID: [name], female    DOB: [birth year], 55 y.o.   MRN: [id number]  Chief Complaint  Patient presents with   Follow-up    BP and meds    HPI Patient was seen today for f/u on HTN.  Pt states she has yet to get a bp cuff for home use.  Pt states she has not been feeling bad, but cannot see herself taking bp meds for the rest of her life.  Pt eating fast food, cake, ice cream, etc.  R knee pain improving.  Taking vitamin D, but states the pharmacy only gives her 4 pills at a time.  Past Medical History:  Diagnosis Date   Fibroids     Allergies  Allergen Reactions   Penicillins Hives    ROS General: Denies fever, chills, night sweats, changes in weight, changes in appetite HEENT: Denies headaches, ear pain, changes in vision, rhinorrhea, sore throat CV: Denies CP, palpitations, SOB, orthopnea Pulm: Denies SOB, cough, wheezing GI: Denies abdominal pain, nausea, vomiting, diarrhea, constipation GU: Denies dysuria, hematuria, frequency, vaginal discharge Msk: Denies muscle cramps, joint pains Neuro: Denies weakness, numbness, tingling Skin: Denies rashes, bruising Psych: Denies depression, anxiety, hallucinations     Objective:    Blood pressure 128/88, pulse 70, temperature 98.4 F (36.9 C), temperature source Oral, weight 165 lb 9.6 oz (75.1 kg), last menstrual period 06/13/2018, SpO2 96 %.   Gen. Pleasant, well-nourished, in no distress, normal affect   HEENT: [place]/AT, face symmetric, conjunctiva clear, no scleral icterus, PERRLA, EOMI, nares patent without drainage Lungs: no accessory muscle use Cardiovascular: RRR, no peripheral edema Musculoskeletal: No deformities, no cyanosis or clubbing, normal tone Neuro:  A&Ox3, CN II-XII intact, normal gait Skin:  Warm, no lesions/ rash   Wt Readings from Last 3 Encounters:  05/19/21 165 lb 9.6 oz (75.1 kg)  04/15/21 169 lb 9.6 oz (76.9 kg)  01/20/21 172 lb 12.8 oz (78.4 kg)    Lab Results   Component Value Date   WBC 5.9 04/15/2021   HGB 13.8 04/15/2021   HCT 42.1 04/15/2021   PLT 306.0 04/15/2021   GLUCOSE 92 04/15/2021   CHOL 136 04/15/2021   TRIG 62.0 04/15/2021   HDL 53.10 04/15/2021   LDLCALC 71 04/15/2021   ALT 28 04/15/2021   AST 25 04/15/2021   NA 138 04/15/2021   K 4.2 04/15/2021   CL 105 04/15/2021   CREATININE 0.55 04/15/2021   BUN 16 04/15/2021   CO2 27 04/15/2021   TSH 1.63 04/15/2021   HGBA1C 5.8 04/15/2021    Assessment/Plan:  Essential hypertension  -improving, but elevated -continue HCTZ 12.5 mg daily -Encouraged to obtain BP cuff for home use.  Keep a log to bring with you to clinic -lifestyle modifications encouraged - Plan: hydrochlorothiazide (MICROZIDE) 12.5 MG capsule  Prediabetes -Hemoglobin A1c 5.8% on 04/15/2021 -Given family history of DM, patient encouraged to decrease the amount of sweets and carbohydrates she is eating as well as increasing her physical activities  Patient encouraged to check with pharmacy in regards to her vitamin D prescription.   F/u in 3 months  Grier Mitts, MD

## 2021-06-07 ENCOUNTER — Other Ambulatory Visit (HOSPITAL_COMMUNITY): Payer: Self-pay

## 2021-06-14 ENCOUNTER — Ambulatory Visit: Payer: 59 | Admitting: Family Medicine

## 2021-06-14 ENCOUNTER — Encounter: Payer: Self-pay | Admitting: Family Medicine

## 2021-06-14 VITALS — BP 142/100 | HR 70 | Temp 98.7°F | Wt 168.0 lb

## 2021-06-14 DIAGNOSIS — M1711 Unilateral primary osteoarthritis, right knee: Secondary | ICD-10-CM

## 2021-06-14 DIAGNOSIS — I10 Essential (primary) hypertension: Secondary | ICD-10-CM | POA: Diagnosis not present

## 2021-06-14 DIAGNOSIS — R7303 Prediabetes: Secondary | ICD-10-CM | POA: Diagnosis not present

## 2021-06-14 DIAGNOSIS — M79671 Pain in right foot: Secondary | ICD-10-CM | POA: Diagnosis not present

## 2021-06-14 NOTE — Progress Notes (Signed)
Subjective:    Patient ID: [name], female    DOB: 09-03-1965, 55 y.o.   MRN: [id number]  Chief Complaint  Patient presents with   Pain    Right knee and ankle. Took some tylenol    HPI Patient was seen today for ongoing concern.  Pt endorses R heel pain x 2 wks.  States pain occurs first thing in am and last throughout the day.  At times unable to go to work 2/2 pain.  Pain in R heel goes up to R medial knee.  Pt had increased sodium.  Endorses eating 15 pcs of bacon, 5 tacos, candy, peanuts, 2 sodas, tea, Monster drinks.  Not checking bp at home. Does not like being on bp meds and wants to get off.    Past Medical History:  Diagnosis Date   Fibroids     Allergies  Allergen Reactions   Penicillins Hives    ROS General: Denies fever, chills, night sweats, changes in weight, changes in appetite HEENT: Denies headaches, ear pain, changes in vision, rhinorrhea, sore throat CV: Denies CP, palpitations, SOB, orthopnea Pulm: Denies SOB, cough, wheezing GI: Denies abdominal pain, nausea, vomiting, diarrhea, constipation GU: Denies dysuria, hematuria, frequency, vaginal discharge Msk: Denies muscle cramps, joint pains  +R heel pain, R knee pain Neuro: Denies weakness, numbness, tingling Skin: Denies rashes, bruising Psych: Denies depression, anxiety, hallucinations    Objective:    Blood pressure (!) 142/100, pulse 70, temperature 98.7 F (37.1 C), temperature source Oral, weight 168 lb (76.2 kg), last menstrual period 06/13/2018, SpO2 98 %.  Gen. Pleasant, well-nourished, in no distress, normal affect   HEENT: [place]/AT, face symmetric, conjunctiva clear, no scleral icterus, PERRLA, EOMI, nares patent without drainage Lungs: no accessory muscle use, CTAB, no wheezes or rales Cardiovascular: RRR, no m/r/g, no peripheral edema Musculoskeletal: No crepitus or edema.  TTP of R joint line.  No deformities, no cyanosis or clubbing, normal tone Neuro:  A&Ox3, CN II-XII intact, normal  gait Skin:  Warm, no lesions/ rash   Wt Readings from Last 3 Encounters:  06/14/21 168 lb (76.2 kg)  05/19/21 165 lb 9.6 oz (75.1 kg)  04/15/21 169 lb 9.6 oz (76.9 kg)    Lab Results  Component Value Date   WBC 5.9 04/15/2021   HGB 13.8 04/15/2021   HCT 42.1 04/15/2021   PLT 306.0 04/15/2021   GLUCOSE 92 04/15/2021   CHOL 136 04/15/2021   TRIG 62.0 04/15/2021   HDL 53.10 04/15/2021   LDLCALC 71 04/15/2021   ALT 28 04/15/2021   AST 25 04/15/2021   NA 138 04/15/2021   K 4.2 04/15/2021   CL 105 04/15/2021   CREATININE 0.55 04/15/2021   BUN 16 04/15/2021   CO2 27 04/15/2021   TSH 1.63 04/15/2021   HGBA1C 5.8 04/15/2021    Assessment/Plan:  Pain of right heel  -discussed possible causes including heel spur, adjusting walk for knee pain, plantar fascitiis, etc. -supportive care including rest, elevation, ice, heat, compression, Tylenol or NSAIDS, etc -discussed wearing supportive shoes/replacing insole. - Plan: Ambulatory referral to Orthopedic Surgery  Essential hypertension -uncontrolled -discussed the importance of medication compliance -pt advised additional medication like needed.  Declines at this time, wishes to focus on lifestyle modifications -continue HCTZ 12.5 mg daily -advised to check bp at home and keep a log to bring to clinic -f/u in 4 wk  Primary osteoarthritis of right knee  -discussed supportive care including Tylenol arthritis strength, OTC voltaren gel, heat, rest,etc -  xray R knee 01/10/20 with mild OA medial compartment jt space narrowing and peripheral spurring.  Small knee jt effusion. - Plan: Ambulatory referral to Orthopedic Surgery  Prediabetes -hgb A1C 5.8% on 04/15/21 -lifestyle modifications strongly encouraged. -pt advised to cut down on drinking sodas/caffeinated drinks every day  F/u in 1 month  Grier Mitts, MD

## 2021-06-22 ENCOUNTER — Ambulatory Visit (INDEPENDENT_AMBULATORY_CARE_PROVIDER_SITE_OTHER): Payer: 59

## 2021-06-22 ENCOUNTER — Other Ambulatory Visit: Payer: Self-pay

## 2021-06-22 ENCOUNTER — Ambulatory Visit (INDEPENDENT_AMBULATORY_CARE_PROVIDER_SITE_OTHER): Payer: 59 | Admitting: Orthopaedic Surgery

## 2021-06-22 DIAGNOSIS — M25561 Pain in right knee: Secondary | ICD-10-CM | POA: Diagnosis not present

## 2021-06-22 DIAGNOSIS — M79671 Pain in right foot: Secondary | ICD-10-CM | POA: Diagnosis not present

## 2021-06-22 DIAGNOSIS — G8929 Other chronic pain: Secondary | ICD-10-CM | POA: Diagnosis not present

## 2021-06-22 NOTE — Progress Notes (Signed)
Office Visit Note   Patient: [name]           Date of Birth: [birth year]           MRN: [id number] Visit Date: 06/22/2021              Requested by: Billie Ruddy, MD [place],   [id number] PCP: Billie Ruddy, MD   Assessment & Plan: Visit Diagnoses:  1. Pain in right foot   2. Chronic pain of right knee     Plan: Impression is right knee OA possible degenerative medial meniscal tear and plan fasciitis and insertional Achilles tendinopathy.  Treatment options were discussed but my impression is that she is not that interested in doing anything more than just medications at this time.  She was more concerned about obtaining FMLA so that she does not accrue points for her absences.  I directed her to human resources for those issues.  Follow-up as needed.  Follow-Up Instructions: No follow-ups on file.   Orders:  Orders Placed This Encounter  Procedures   XR KNEE 3 VIEW RIGHT   XR Foot Complete Right   No orders of the defined types were placed in this encounter.     Procedures: No procedures performed   Clinical Data: No additional findings.   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  She has had problems for quite some time and has had to miss work as a result.  She has trouble kneeling and feels occasional weakness and giving way.  She does feel constant pain.  Denies any mechanical symptoms.  Feels like there is swelling in the right knee.  In terms of right heel she has heel pain is worse with activities.  She does a lot of walking at work.  Tylenol has not provided much relief.   Review of Systems  Constitutional: Negative.   HENT: Negative.    Eyes: Negative.   Respiratory: Negative.    Cardiovascular: Negative.    Endocrine: Negative.   Musculoskeletal: Negative.   Neurological: Negative.   Hematological: Negative.   Psychiatric/Behavioral: Negative.    All other systems reviewed and are negative.   Objective: Vital Signs: LMP 06/13/2018   Physical Exam Vitals and nursing note reviewed.  Constitutional:      Appearance: She is well-developed.  Pulmonary:     Effort: Pulmonary effort is normal.  Skin:    General: Skin is warm.     Capillary Refill: Capillary refill takes less than 2 seconds.  Neurological:     Mental Status: She is alert and oriented to person, place, and time.  Psychiatric:        Behavior: Behavior normal.        Thought Content: Thought content normal.        Judgment: Judgment normal.    Ortho Exam  Right knee exam shows no effusion.  Range of motion is well-preserved.  Collaterals and cruciates are stable.  No joint line tenderness.  1+ patellofemoral crepitus with range of motion.  Right heel shows tenderness of the distal Achilles insertion and the plantar fashion insertion.  Negative calcaneal squeeze.  Exam is otherwise unremarkable.  Specialty Comments:  No specialty comments available.  Imaging:  XR Foot Complete Right  Result Date: 06/22/2021 Small enthesophyte formation of the calcaneus  XR KNEE 3 VIEW RIGHT  Result Date: 06/22/2021 There is about 50% joint space narrowing of the medial compartment.    PMFS History: Patient Active Problem List   Diagnosis Date Noted   Essential hypertension 04/15/2021   Bunion, right foot 04/15/2021   Flat feet, bilateral 04/15/2021   Primary osteoarthritis of right knee 12/14/2020   Class 1 obesity due to excess calories with body mass index (BMI) of 31.0 to 31.9 in adult 12/14/2020   Fibroadenoma of left breast 12/23/2019   Past Medical History:  Diagnosis Date   Fibroids     Family History  Problem Relation Age of Onset   Diabetes Mother    Hypertension Mother    Diabetes Father     Hypertension Father     Past Surgical History:  Procedure Laterality Date   MYOMECTOMY ABDOMINAL APPROACH     TONSILLECTOMY     Social History   Occupational History   Not on file  Tobacco Use   Smoking status: Former   Smokeless tobacco: Never  Vaping Use   Vaping Use: Never used  Substance and Sexual Activity   Alcohol use: Yes    Comment: occass.    Drug use: No   Sexual activity: Yes

## 2021-06-27 ENCOUNTER — Other Ambulatory Visit: Payer: Self-pay | Admitting: Family Medicine

## 2021-06-27 DIAGNOSIS — E559 Vitamin D deficiency, unspecified: Secondary | ICD-10-CM

## 2021-07-04 IMAGING — MG DIGITAL SCREENING BILAT W/ CAD
4 series · 4 of 4 positions shown · non-contrast
Comparison: Previous exam(s).

CLINICAL DATA: Screening.

EXAM:
DIGITAL SCREENING BILATERAL MAMMOGRAM WITH CAD

[R MLO]
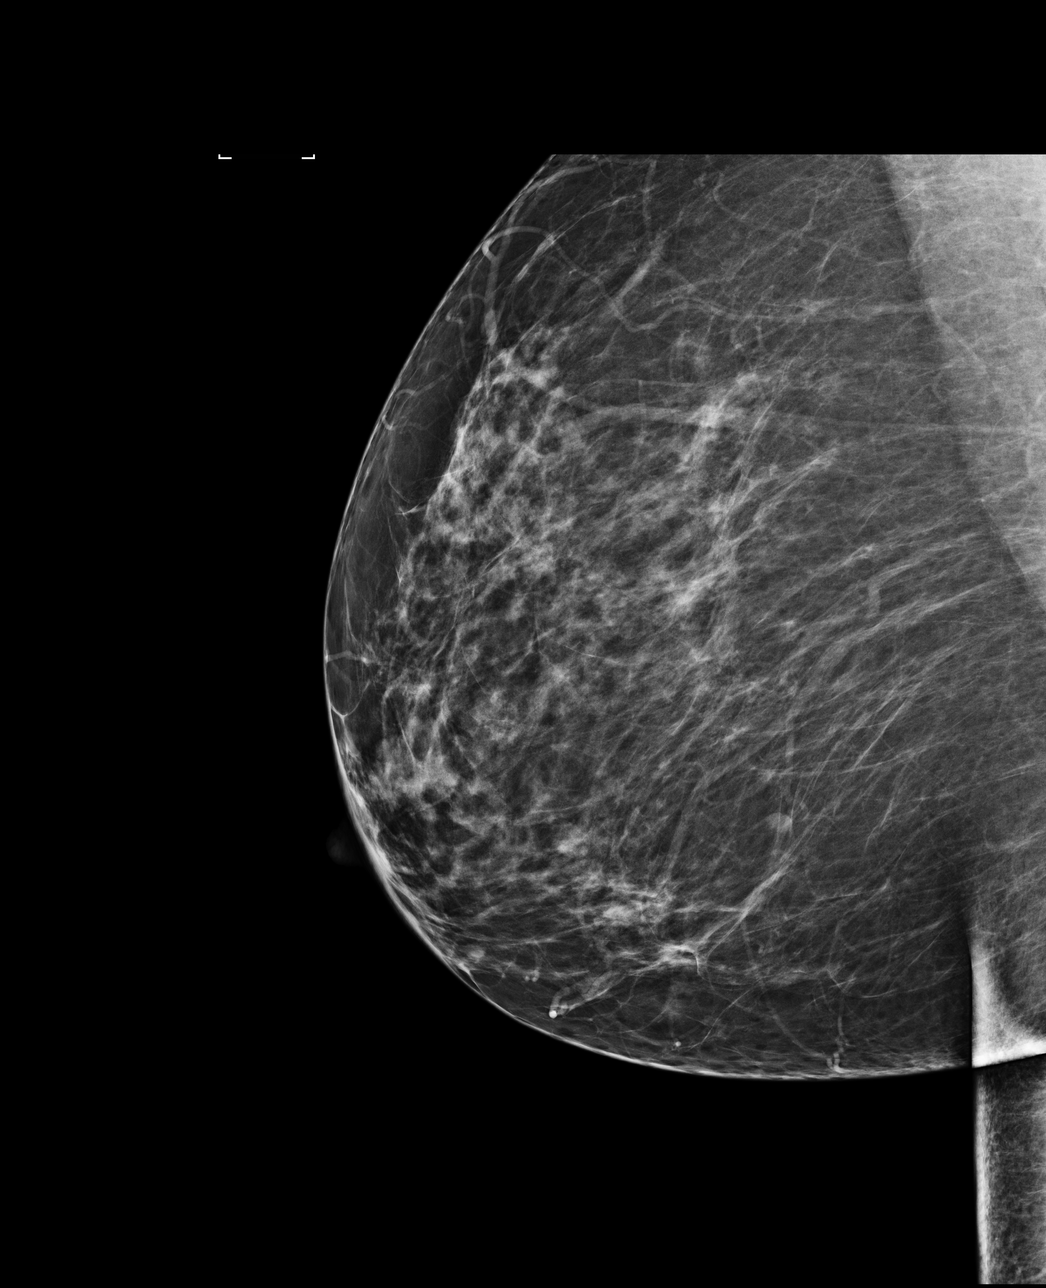

[L CC]
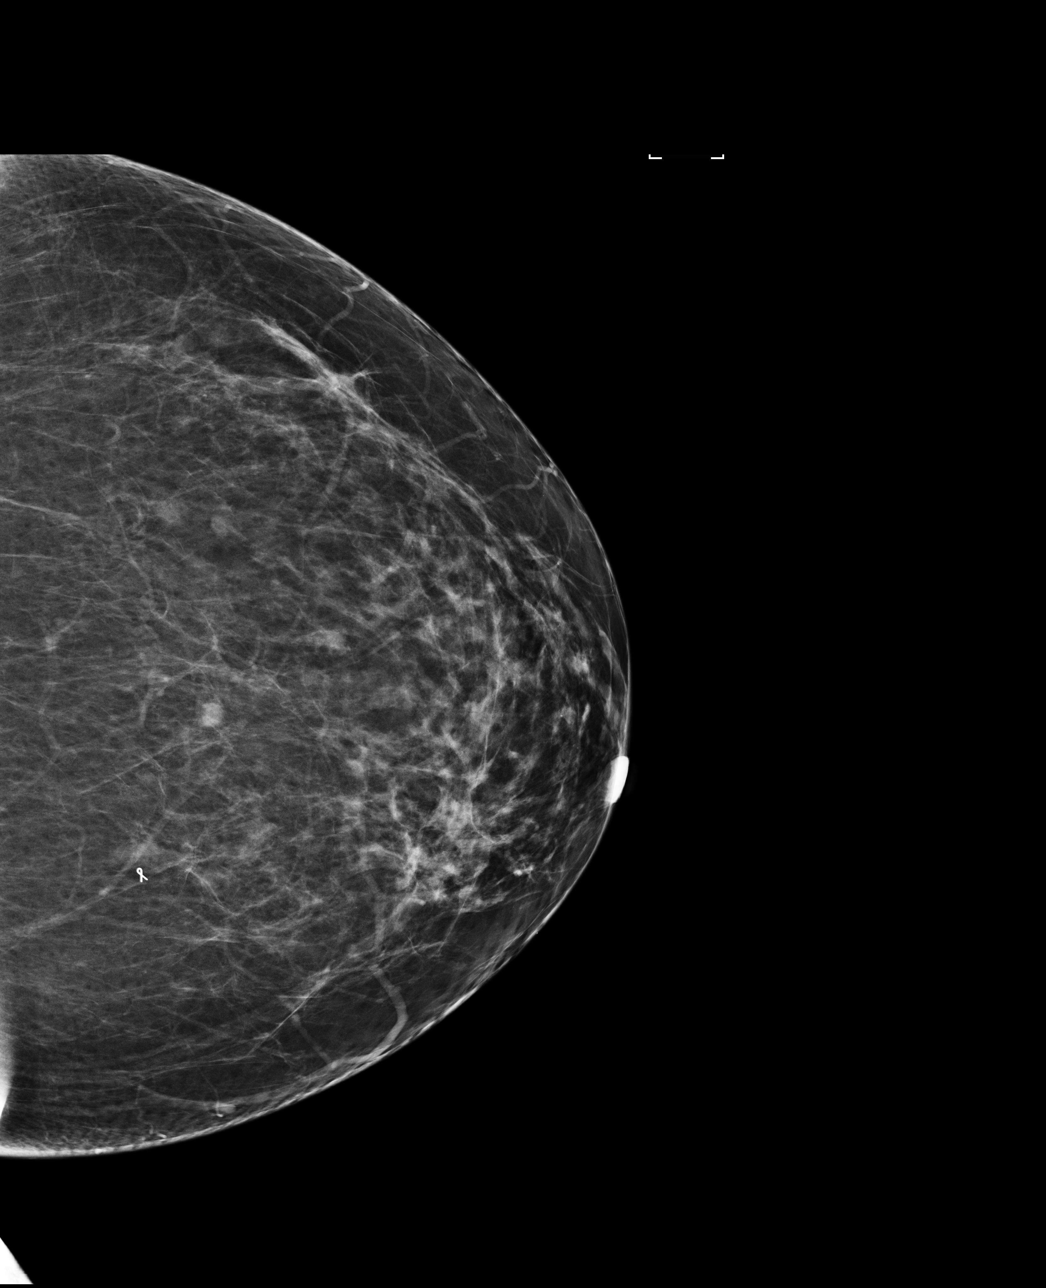

[R CC]
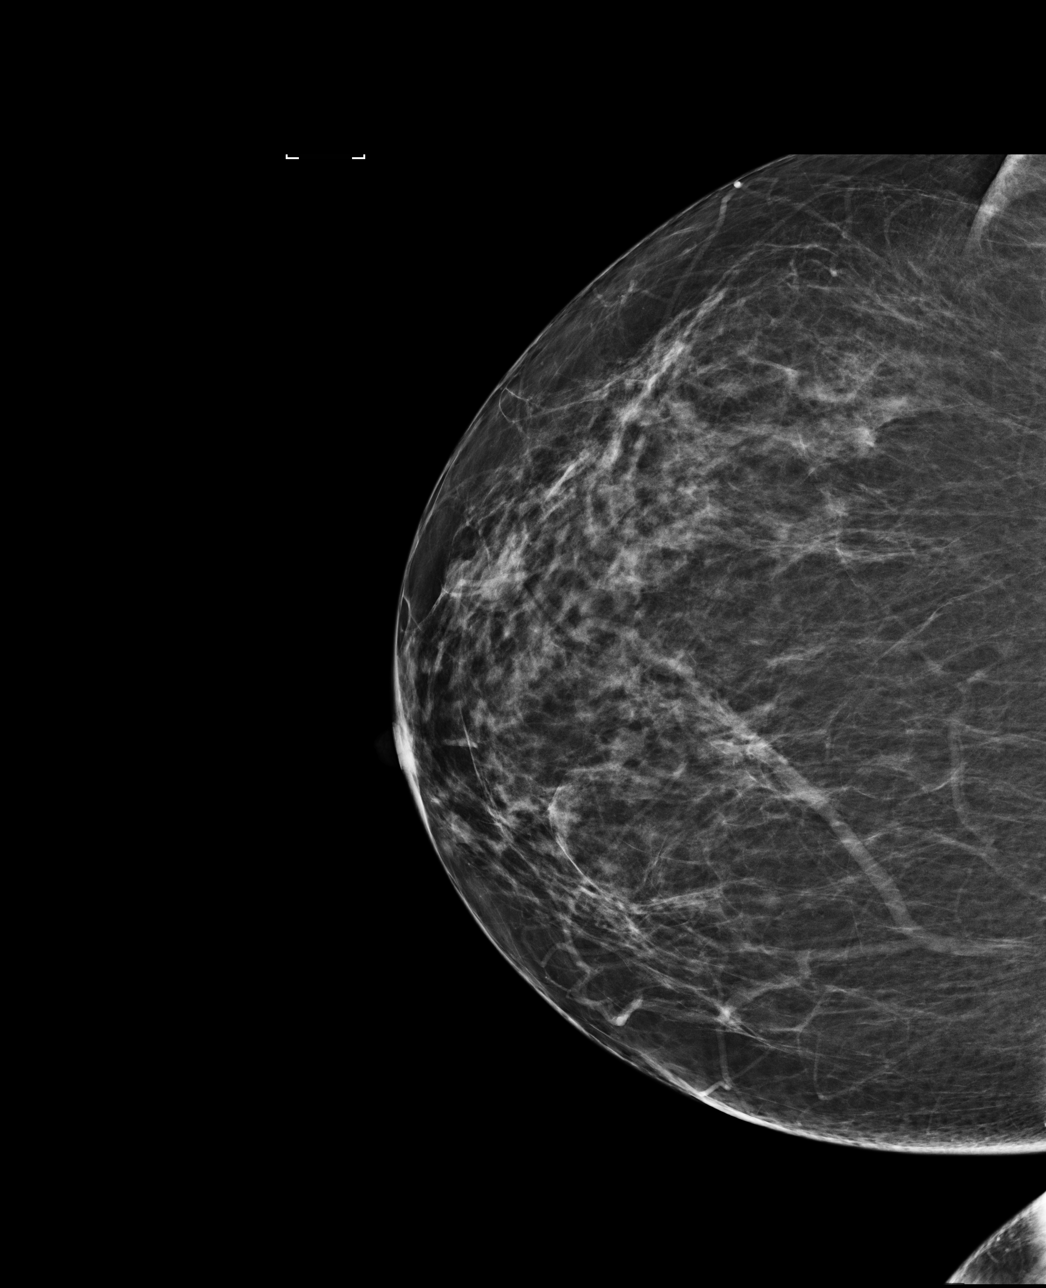

[L MLO]
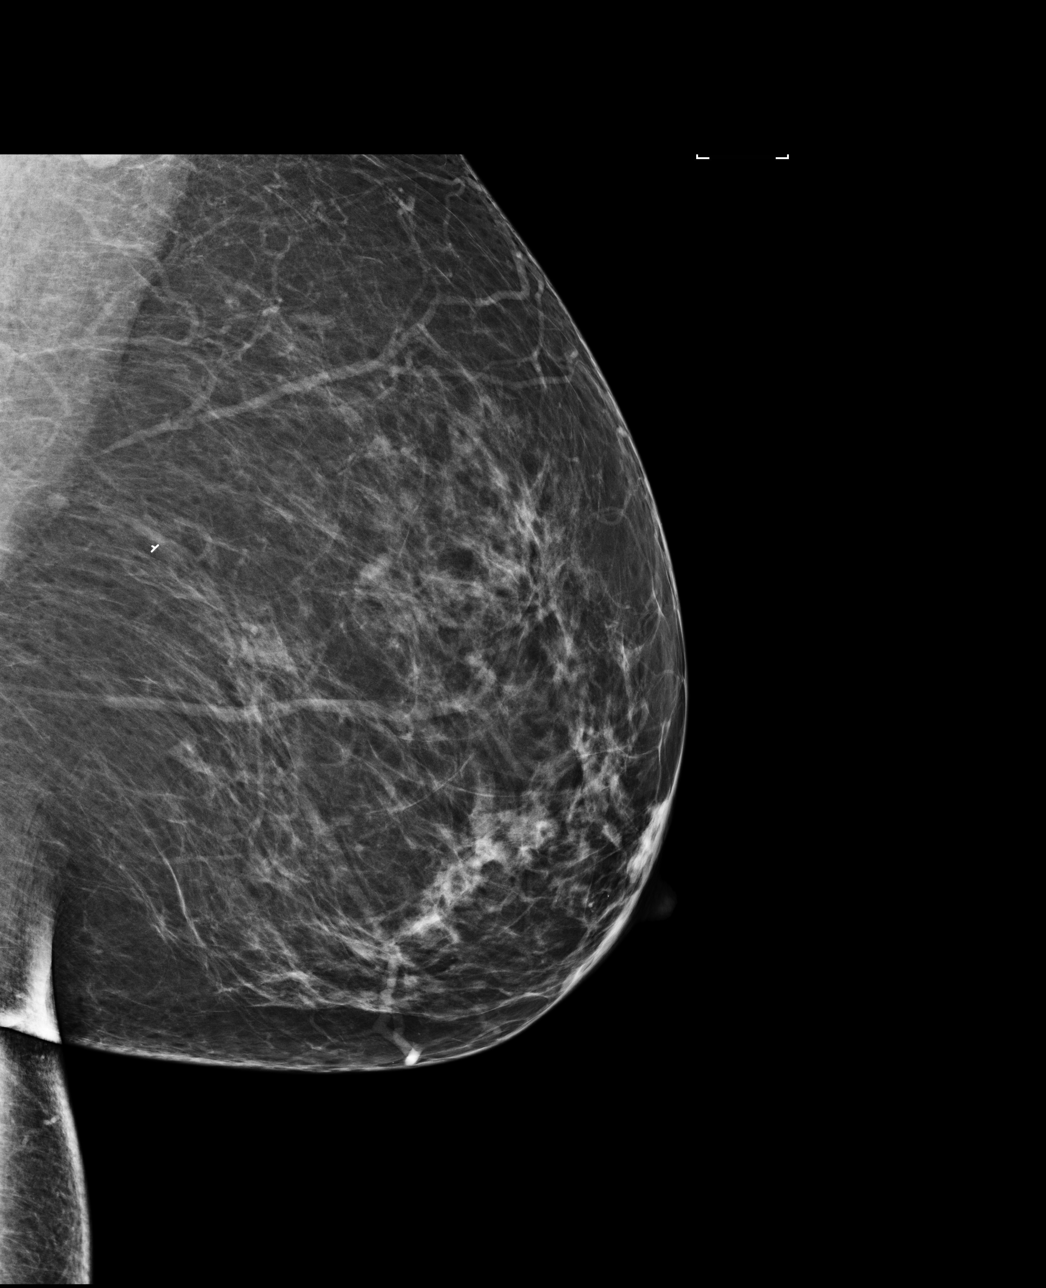

[4 of 4 positions shown; findings below may reference images not displayed]

ACR Breast Density Category c: The breast tissue is heterogeneously
dense, which may obscure small masses.
FINDINGS: In the left breast, a possible mass warrants further evaluation. In
the right breast, no findings suspicious for malignancy.

Images were processed with CAD.
IMPRESSION: Further evaluation is suggested for possible mass in the left
breast.

RECOMMENDATION:
Diagnostic mammogram and possibly ultrasound of the left breast.
(Code:[id number]-[id number])

The patient will be contacted regarding the findings, and additional
imaging will be scheduled.

BI-RADS CATEGORY  0: Incomplete. Need additional imaging evaluation
and/or prior mammograms for comparison.

## 2021-07-12 IMAGING — US US BREAST*L* LIMITED INC AXILLA
1 series · 14 of 14 positions shown · non-contrast
Comparison: Previous exam(s).

CLINICAL DATA: Patient recalled from screening for left breast
mass.

EXAM:
DIGITAL DIAGNOSTIC LEFT MAMMOGRAM WITH CAD AND TOMO
ULTRASOUND LEFT BREAST

[Series 1: us breast*left* limited inc axilla · 0.06mm/px · 14 of 14 slices shown]
[im 1/14]
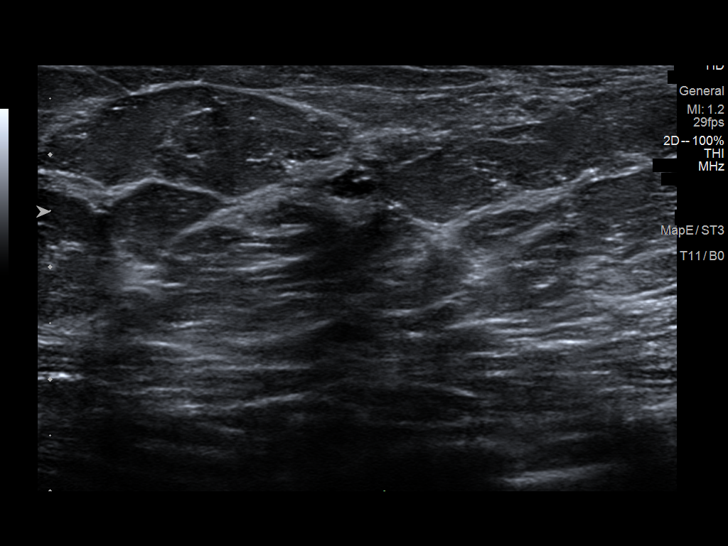
[im 2/14]
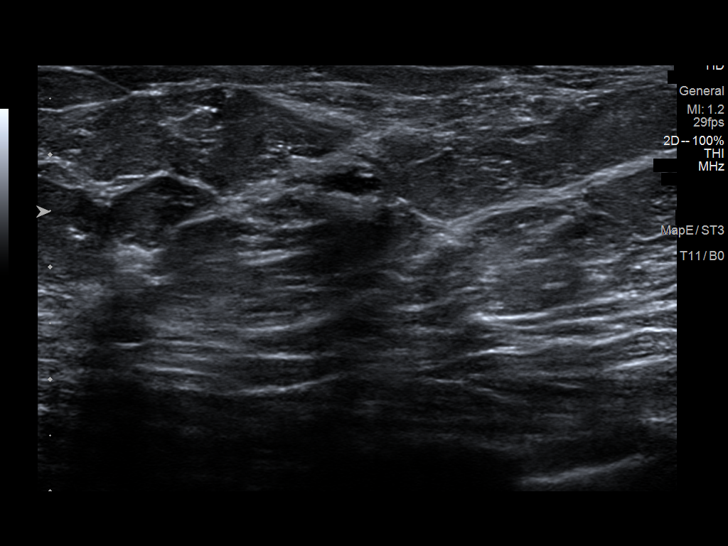
[im 3/14]
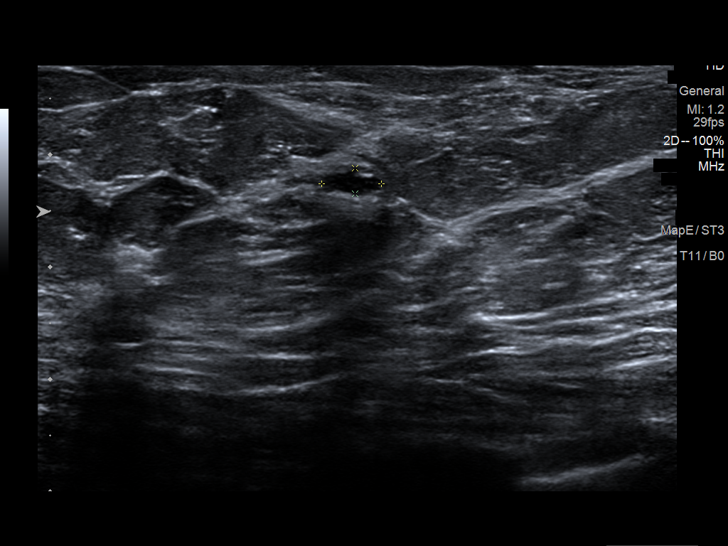
[im 4/14]
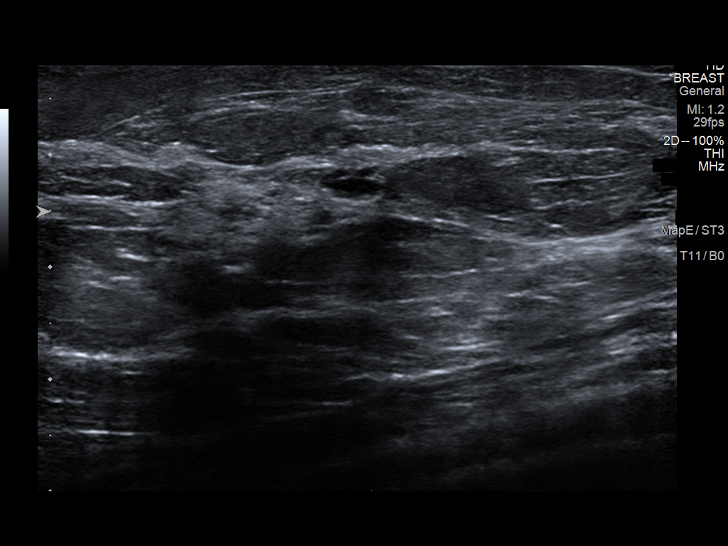
[im 5/14]
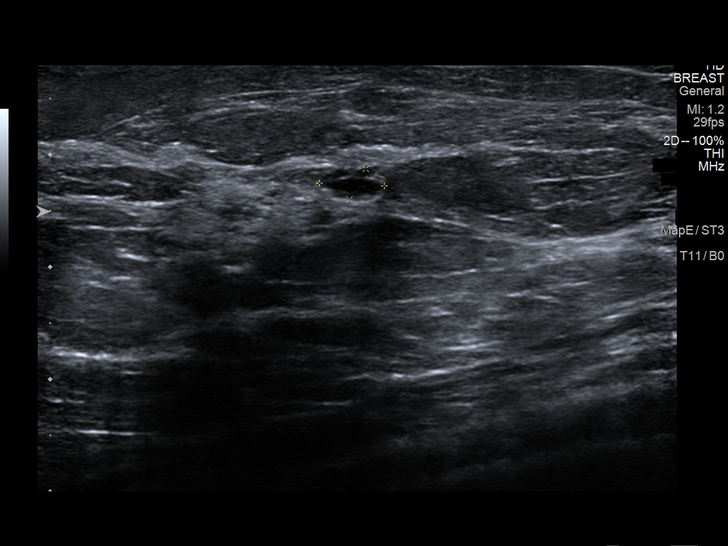
[im 6/14]
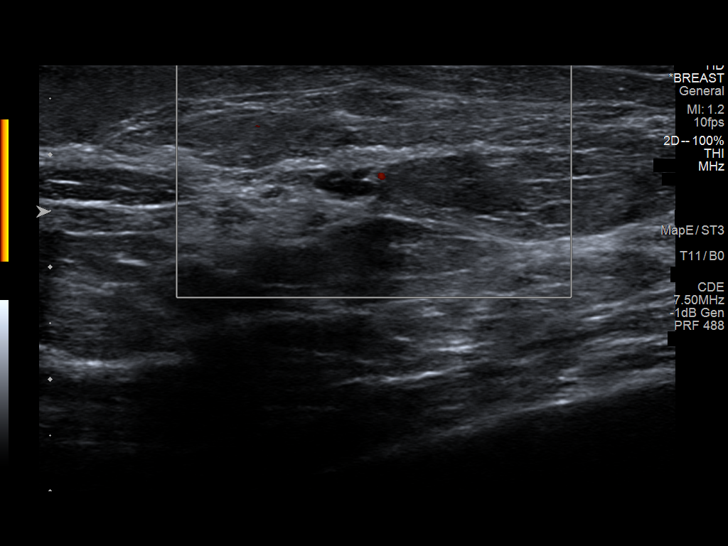
[im 7/14]
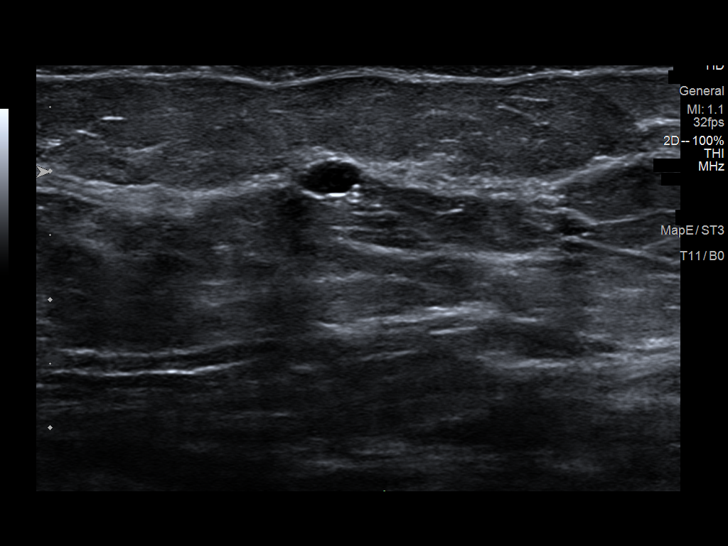
[im 8/14]
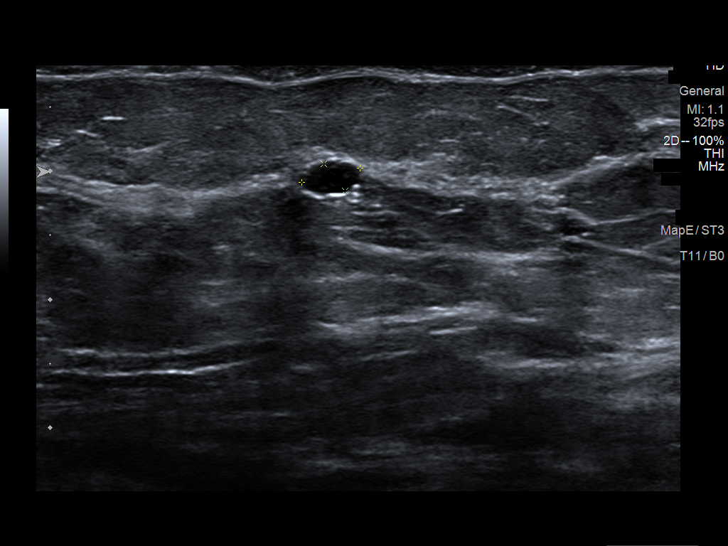
[im 9/14]
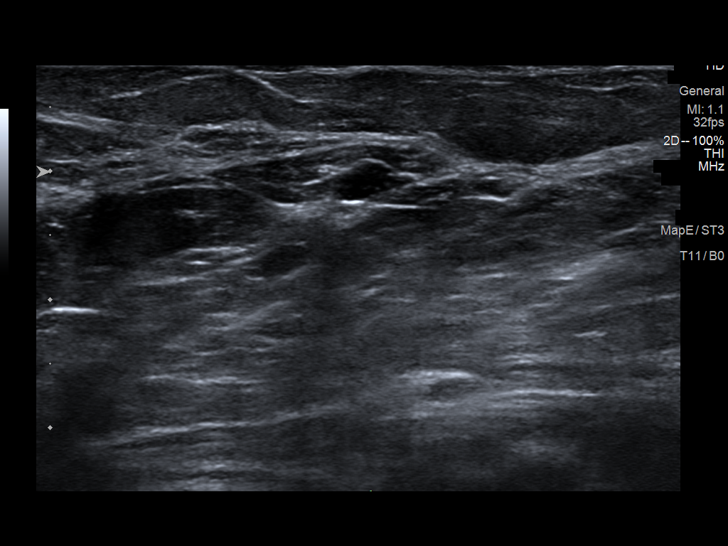
[im 10/14]
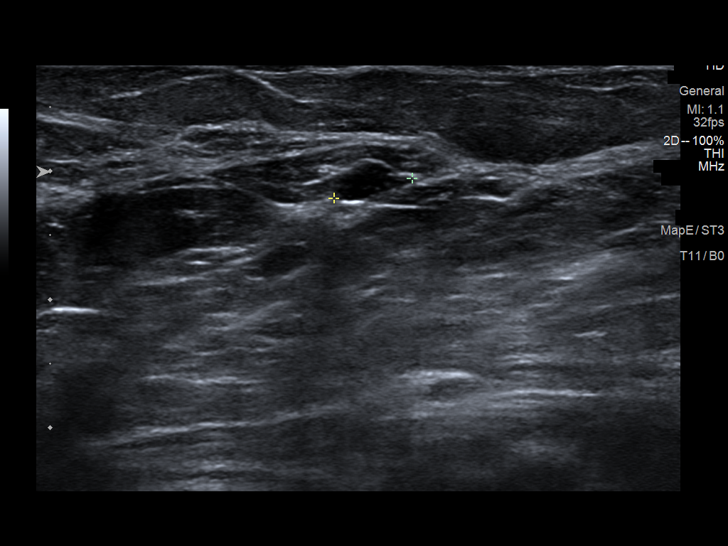
[im 11/14]
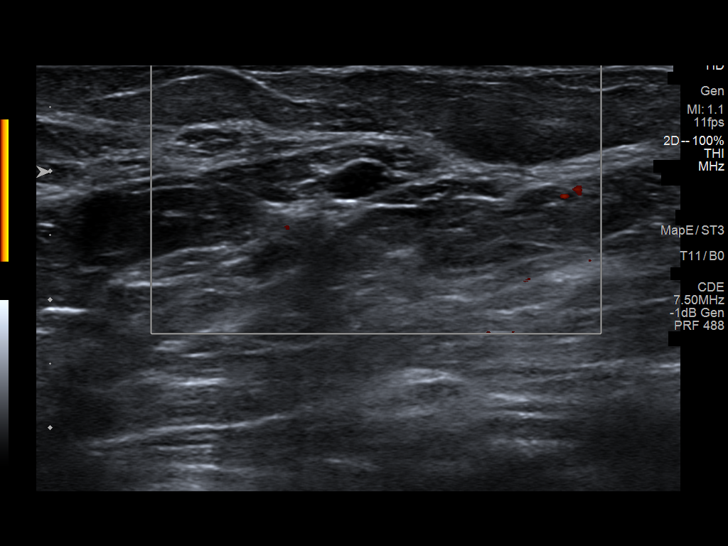
[im 12/14]
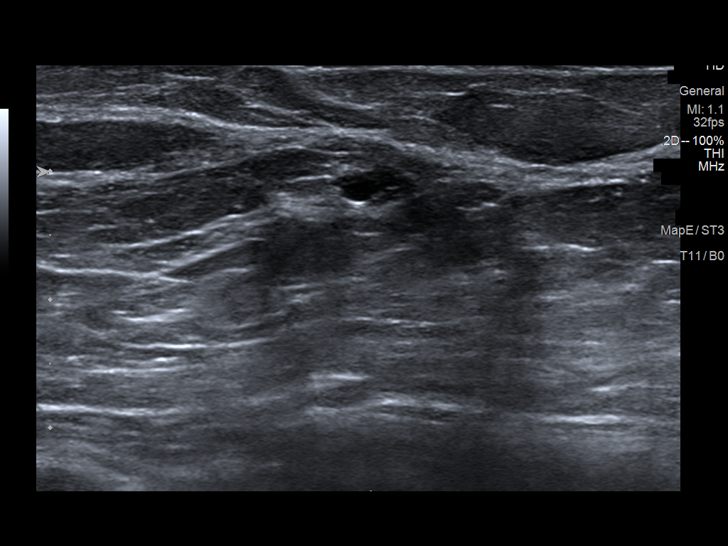
[im 13/14]
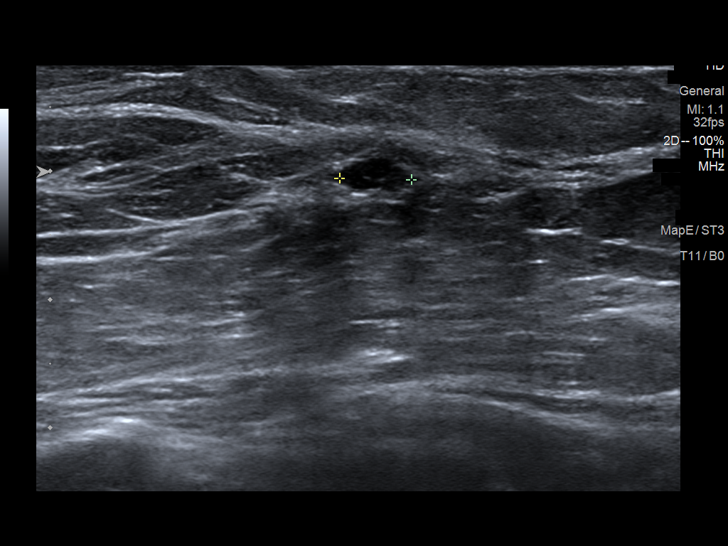
[im 14/14]
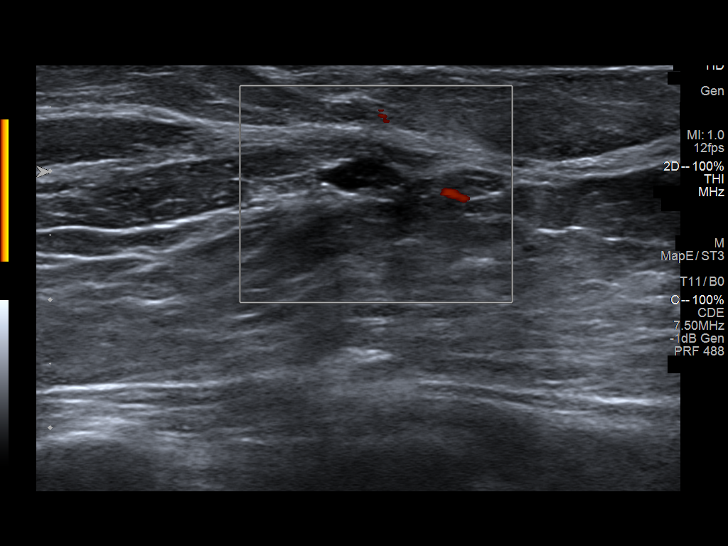

[14 of 14 positions shown; findings below may reference images not displayed]

ACR Breast Density Category b: There are scattered areas of
fibroglandular density.
FINDINGS: 3D images of left breast were obtained. There is a persistent
lobular did density mass within the upper-outer left breast.
Additionally within the inferior left breast middle depth there is a
round lobular mass.

Mammographic images were processed with CAD.

Targeted ultrasound is performed, showing a 5 x 2 x 6 mm cluster of
cysts left breast 2 o'clock position 9 cm from nipple.

There is a 5 x 3 x 6 mm cyst left breast 5 o'clock position 4 cm
from nipple.
IMPRESSION: Left breast cyst and cluster of cysts.

No mammographic evidence for malignancy.

RECOMMENDATION:
Screening mammogram in one year.(Code:9C-[id number])

I have discussed the findings and recommendations with the patient.
If applicable, a reminder letter will be sent to the patient
regarding the next appointment.

BI-RADS CATEGORY  2: Benign.

## 2021-08-04 ENCOUNTER — Encounter: Payer: Self-pay | Admitting: Family Medicine

## 2021-08-19 ENCOUNTER — Ambulatory Visit: Payer: 59 | Admitting: Family Medicine

## 2021-08-25 ENCOUNTER — Ambulatory Visit (INDEPENDENT_AMBULATORY_CARE_PROVIDER_SITE_OTHER): Payer: 59 | Admitting: Family Medicine

## 2021-08-25 ENCOUNTER — Other Ambulatory Visit (HOSPITAL_COMMUNITY): Payer: Self-pay

## 2021-08-25 ENCOUNTER — Encounter: Payer: Self-pay | Admitting: Family Medicine

## 2021-08-25 VITALS — BP 120/70 | HR 70 | Temp 97.9°F | Wt 163.0 lb

## 2021-08-25 DIAGNOSIS — Z23 Encounter for immunization: Secondary | ICD-10-CM

## 2021-08-25 DIAGNOSIS — I10 Essential (primary) hypertension: Secondary | ICD-10-CM

## 2021-08-25 NOTE — Addendum Note (Signed)
Addended by: Rebecca Eaton on: 08/25/2021 11:05 AM   Modules accepted: Orders

## 2021-08-25 NOTE — Progress Notes (Signed)
Subjective:    Patient ID: [name], female    DOB: 1966-01-17, 56 y.o.   MRN: [id number]  Chief Complaint  Patient presents with   Follow-up    Hypertension. 120/80 avg. At home    HPI Patient was seen today for HTN.  Patient currently taking hydrochlorothiazide 12.5 mg.  Since last OFV patient started increasing intake of water, eating better, decreasing intake of Monster energy drinks and bacon.  Her blood pressure has improved.  Typically 120/80.  Patient denies headaches, CP, changes in vision, dizziness.  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  Patient tries to catch up on sleep on the weekends.  Patient trying to lose weight.  Past Medical History:  Diagnosis Date   Fibroids     Allergies  Allergen Reactions   Penicillins Hives    ROS General: Denies fever, chills, night sweats, changes in weight, changes in appetite HEENT: Denies headaches, ear pain, changes in vision, rhinorrhea, sore throat CV: Denies CP, palpitations, SOB, orthopnea Pulm: Denies SOB, cough, wheezing GI: Denies abdominal pain, nausea, vomiting, diarrhea, constipation GU: Denies dysuria, hematuria, frequency, vaginal discharge Msk: Denies muscle cramps, joint pains Neuro: Denies weakness, numbness, tingling Skin: Denies rashes, bruising Psych: Denies depression, anxiety, hallucinations     Objective:    Blood pressure 120/70, pulse 70, temperature 97.9 F (36.6 C), temperature source Oral, weight 163 lb (73.9 kg), last menstrual period 06/13/2018, SpO2 98 %.   Gen. Pleasant, well-nourished, in no distress, normal affect   HEENT: [place]/AT, face symmetric, conjunctiva clear, no scleral icterus, PERRLA, EOMI, nares patent without drainage Lungs: no accessory muscle use, CTAB, no wheezes or rales Cardiovascular: RRR, no m/r/g, no peripheral edema Abdomen: BS present, soft, NT/ND. Musculoskeletal: No deformities, no cyanosis or  clubbing, normal tone Neuro:  A&Ox3, CN II-XII intact, normal gait Skin:  Warm, no lesions/ rash   Wt Readings from Last 3 Encounters:  08/25/21 163 lb (73.9 kg)  06/14/21 168 lb (76.2 kg)  05/19/21 165 lb 9.6 oz (75.1 kg)    Lab Results  Component Value Date   WBC 5.9 04/15/2021   HGB 13.8 04/15/2021   HCT 42.1 04/15/2021   PLT 306.0 04/15/2021   GLUCOSE 92 04/15/2021   CHOL 136 04/15/2021   TRIG 62.0 04/15/2021   HDL 53.10 04/15/2021   LDLCALC 71 04/15/2021   ALT 28 04/15/2021   AST 25 04/15/2021   NA 138 04/15/2021   K 4.2 04/15/2021   CL 105 04/15/2021   CREATININE 0.55 04/15/2021   BUN 16 04/15/2021   CO2 27 04/15/2021   TSH 1.63 04/15/2021   HGBA1C 5.8 04/15/2021    Assessment/Plan:  Essential hypertension -Controlled -Continue HCTZ 12.5 mg daily -Continue lifestyle modifications -Continue monitoring BP at home.  Notify clinic for readings consistently greater than 140/90.  Need for shingles vaccine  -First dose of shingles vaccine given this visit -Dose due in 2-6 months from first dose  F/u 3 months, sooner if needed  Grier Mitts, MD

## 2021-08-25 NOTE — Patient Instructions (Addendum)
You had a tetanus shot on 01/07/2018.  Your tetanus vaccine is good for 10 years so it is not due again until 2029.  You were given the first dose of shingles vaccine this visit.  The second dose is due in 2 to 6 months from the first dose.

## 2021-10-07 ENCOUNTER — Other Ambulatory Visit: Payer: Self-pay | Admitting: Obstetrics and Gynecology

## 2021-10-07 DIAGNOSIS — Z1231 Encounter for screening mammogram for malignant neoplasm of breast: Secondary | ICD-10-CM

## 2021-11-02 ENCOUNTER — Other Ambulatory Visit (HOSPITAL_COMMUNITY): Payer: Self-pay

## 2021-11-03 ENCOUNTER — Other Ambulatory Visit (HOSPITAL_COMMUNITY): Payer: Self-pay

## 2021-11-17 ENCOUNTER — Other Ambulatory Visit (HOSPITAL_COMMUNITY): Payer: Self-pay

## 2021-11-30 DIAGNOSIS — N644 Mastodynia: Secondary | ICD-10-CM | POA: Diagnosis not present

## 2021-12-27 ENCOUNTER — Ambulatory Visit (INDEPENDENT_AMBULATORY_CARE_PROVIDER_SITE_OTHER): Payer: 59 | Admitting: *Deleted

## 2021-12-27 DIAGNOSIS — Z23 Encounter for immunization: Secondary | ICD-10-CM | POA: Diagnosis not present

## 2021-12-29 DIAGNOSIS — Z124 Encounter for screening for malignant neoplasm of cervix: Secondary | ICD-10-CM | POA: Diagnosis not present

## 2021-12-29 DIAGNOSIS — Z01419 Encounter for gynecological examination (general) (routine) without abnormal findings: Secondary | ICD-10-CM | POA: Diagnosis not present

## 2021-12-31 ENCOUNTER — Encounter: Payer: Self-pay | Admitting: Family Medicine

## 2022-01-27 ENCOUNTER — Ambulatory Visit
Admission: RE | Admit: 2022-01-27 | Discharge: 2022-01-27 | Disposition: A | Discharge status: Home / Self Care | Payer: 59 | Source: Ambulatory Visit | Attending: Obstetrics and Gynecology | Admitting: Obstetrics and Gynecology

## 2022-01-27 DIAGNOSIS — Z1231 Encounter for screening mammogram for malignant neoplasm of breast: Secondary | ICD-10-CM | POA: Diagnosis not present

## 2022-01-27 LAB — HM MAMMOGRAPHY

## 2022-02-14 ENCOUNTER — Telehealth: Payer: Self-pay

## 2022-02-14 NOTE — Telephone Encounter (Signed)
-  Caller states on her right arm there is red bumps, slightly itchy. Also has one red bump on her buttocks. Noticed these yesterday and last night.  02/11/2022 3:12:51 PM SEE PCP WITHIN 3 DAYS Lolita Lenz, RN, [place]  Referrals REFERRED TO PCP OFFICE  02/14/22 1331- Pt states bumps have cleared up. Using steroid cream that is working. Declines appt for this.  Late 3 month f/u scheduled for 02/23/22

## 2022-02-23 ENCOUNTER — Other Ambulatory Visit (HOSPITAL_COMMUNITY): Payer: Self-pay

## 2022-02-23 ENCOUNTER — Ambulatory Visit: Payer: 59 | Admitting: Family Medicine

## 2022-02-23 ENCOUNTER — Encounter: Payer: Self-pay | Admitting: Family Medicine

## 2022-02-23 VITALS — BP 130/82 | HR 71 | Temp 98.0°F | Wt 164.4 lb

## 2022-02-23 DIAGNOSIS — R7303 Prediabetes: Secondary | ICD-10-CM

## 2022-02-23 DIAGNOSIS — I10 Essential (primary) hypertension: Secondary | ICD-10-CM

## 2022-02-23 MED ORDER — HYDROCHLOROTHIAZIDE 12.5 MG PO CAPS
12.5000 mg | ORAL_CAPSULE | Freq: Every day | ORAL | 3 refills | Status: DC
  Filled 2022-02-23: qty 90, 90d supply, fill #0
  Filled 2022-05-16: qty 90, 90d supply, fill #1
  Filled 2022-07-27: qty 90, 90d supply, fill #2
  Filled 2022-10-27: qty 90, 90d supply, fill #3

## 2022-02-23 NOTE — Progress Notes (Signed)
Subjective:    Patient ID: [name], female    DOB: 1966-05-08, 56 y.o.   MRN: [id number]  Chief Complaint  Patient presents with   Follow-up    BP    HPI Patient was seen today for f./u on HTN.  Pt doing well on HCTZ 12.5 mg daily.  Not checking BP frequently.  States last time she checked it it was 120s/70s.  Patient trying to drink more water.  States drinking core water.  Stopped drinking monsters but is drinking Celsius energy drinks.  Tried to decrease sugar intake.  Also taking Goli Gummies.  Patient inquires when she can stop taking BP medication.  Pt notes a pruritic red rash on R upper arm last wk that has since resolved.  Rash appeared after helping a friend move.  Past Medical History:  Diagnosis Date   Fibroids     Allergies  Allergen Reactions   Penicillins Hives    ROS General: Denies fever, chills, night sweats, changes in weight, changes in appetite HEENT: Denies headaches, ear pain, changes in vision, rhinorrhea, sore throat CV: Denies CP, palpitations, SOB, orthopnea Pulm: Denies SOB, cough, wheezing GI: Denies abdominal pain, nausea, vomiting, diarrhea, constipation GU: Denies dysuria, hematuria, frequency, vaginal discharge Msk: Denies muscle cramps, joint pains Neuro: Denies weakness, numbness, tingling Skin: Denies rashes, bruising Psych: Denies depression, anxiety, hallucinations     Objective:    Blood pressure 130/82, pulse 71, temperature 98 F (36.7 C), temperature source Oral, weight 164 lb 6.4 oz (74.6 kg), last menstrual period 06/13/2018, SpO2 95 %.   Gen. Pleasant, well-nourished, in no distress, normal affect   HEENT: [place]/AT, face symmetric, conjunctiva clear, no scleral icterus, PERRLA, EOMI, nares patent without drainage Lungs: no accessory muscle use, CTAB, no wheezes or rales Cardiovascular: RRR, no m/r/g, no peripheral edema Musculoskeletal: No deformities, no cyanosis or clubbing, normal tone Neuro:  A&Ox3, CN II-XII intact,  normal gait Skin:  Warm, no lesions/ rash   Wt Readings from Last 3 Encounters:  02/23/22 164 lb 6.4 oz (74.6 kg)  08/25/21 163 lb (73.9 kg)  06/14/21 168 lb (76.2 kg)    Lab Results  Component Value Date   WBC 5.9 04/15/2021   HGB 13.8 04/15/2021   HCT 42.1 04/15/2021   PLT 306.0 04/15/2021   GLUCOSE 92 04/15/2021   CHOL 136 04/15/2021   TRIG 62.0 04/15/2021   HDL 53.10 04/15/2021   LDLCALC 71 04/15/2021   ALT 28 04/15/2021   AST 25 04/15/2021   NA 138 04/15/2021   K 4.2 04/15/2021   CL 105 04/15/2021   CREATININE 0.55 04/15/2021   BUN 16 04/15/2021   CO2 27 04/15/2021   TSH 1.63 04/15/2021   HGBA1C 5.8 04/15/2021    Assessment/Plan:  Essential hypertension -controlled -Continue lifestyle modifications -Continue hydrochlorothiazide 12.5 mg daily -Given handout -Patient encouraged to check BP more frequently and keep a log to bring with her to clinic  - Plan: hydrochlorothiazide (MICROZIDE) 12.5 MG capsule  Prediabetes -Hemoglobin A1c 5.8% on 04/16/2019 -Continue reading labels -Continue decreasing sugar and carb intake  F/u in 2 months for CPE  Grier Mitts, MD

## 2022-02-23 NOTE — Patient Instructions (Signed)
Your last physical was on 04/15/2021.  You can set up your next physical on or after that date in October with the ladies upfront.  Your refill for your blood pressure medication was sent to the pharmacy.

## 2022-04-09 DIAGNOSIS — M545 Low back pain, unspecified: Secondary | ICD-10-CM | POA: Diagnosis not present

## 2022-04-11 ENCOUNTER — Encounter: Payer: Self-pay | Admitting: Family Medicine

## 2022-04-11 ENCOUNTER — Ambulatory Visit: Payer: 59 | Admitting: Family Medicine

## 2022-04-11 VITALS — BP 122/84 | HR 71 | Temp 98.3°F | Wt 163.6 lb

## 2022-04-11 DIAGNOSIS — I10 Essential (primary) hypertension: Secondary | ICD-10-CM | POA: Diagnosis not present

## 2022-04-11 DIAGNOSIS — M545 Low back pain, unspecified: Secondary | ICD-10-CM | POA: Diagnosis not present

## 2022-04-11 NOTE — Progress Notes (Signed)
Subjective:    Patient ID: [name], female    DOB: [birth year], 56 y.o.   MRN: [id number]  Chief Complaint  Patient presents with   Back Pain    Low back pain for 4 days, was a real bad pull on lower left side. Went to UC on Saturday, was given meloxicam 12 mg, pain is less intensive.     HPI Patient was seen today for f/u on low back pain. Pt endorses L sided low back pain x 4 days.  Endorses a "pulling" sensation.  Notes feeling stiff/having trouble getting up from seated position.  Pt denies injury.  Went to UC on Sunday, given mobic.  Pt using aspercreme.  States starting to improve.  Tried tylenol when symptoms started but it did not help.  Past Medical History:  Diagnosis Date   Fibroids     Allergies  Allergen Reactions   Penicillins Hives    ROS General: Denies fever, chills, night sweats, changes in weight, changes in appetite HEENT: Denies headaches, ear pain, changes in vision, rhinorrhea, sore throat CV: Denies CP, palpitations, SOB, orthopnea Pulm: Denies SOB, cough, wheezing GI: Denies abdominal pain, nausea, vomiting, diarrhea, constipation GU: Denies dysuria, hematuria, frequency, vaginal discharge Msk: Denies muscle cramps, joint pains +low back pain Neuro: Denies weakness, numbness, tingling Skin: Denies rashes, bruising Psych: Denies depression, anxiety, hallucinations     Objective:    Blood pressure 122/84, pulse 71, temperature 98.3 F (36.8 C), temperature source Oral, weight 163 lb 9.6 oz (74.2 kg), last menstrual period 06/13/2018, SpO2 97 %.  Gen. Pleasant, well-nourished, in no distress, normal affect   HEENT: [place]/AT, face symmetric, conjunctiva clear, no scleral icterus, PERRLA, EOMI, nares patent without drainage Lungs: no accessory muscle use, CTAB, no wheezes or rales Cardiovascular: RRR, no m/r/g, no peripheral edema Musculoskeletal: No TTP of cervical, thoracic, lumbar spine or paraspinal muscles. No LE weakness.  No deformities, no  cyanosis or clubbing, normal tone Neuro:  A&Ox3, CN II-XII intact, normal gait Skin:  Warm, no lesions/ rash   Wt Readings from Last 3 Encounters:  04/11/22 163 lb 9.6 oz (74.2 kg)  02/23/22 164 lb 6.4 oz (74.6 kg)  08/25/21 163 lb (73.9 kg)    Lab Results  Component Value Date   WBC 5.9 04/15/2021   HGB 13.8 04/15/2021   HCT 42.1 04/15/2021   PLT 306.0 04/15/2021   GLUCOSE 92 04/15/2021   CHOL 136 04/15/2021   TRIG 62.0 04/15/2021   HDL 53.10 04/15/2021   LDLCALC 71 04/15/2021   ALT 28 04/15/2021   AST 25 04/15/2021   NA 138 04/15/2021   K 4.2 04/15/2021   CL 105 04/15/2021   CREATININE 0.55 04/15/2021   BUN 16 04/15/2021   CO2 27 04/15/2021   TSH 1.63 04/15/2021   HGBA1C 5.8 04/15/2021    Assessment/Plan:  Acute left-sided low back pain without sciatica -improving -discussed possible causes including muscle strain. -consider getting a new mattress or using a topper. -continue supportive care including Tylenol, NSAIDs, topical analgesics, heat, stretching, massage, etc. -Continue Mobic as needed -Advised to use proper lifting techniques while at work -Given precautions  Essential hypertension -Controlled -Continue HCTZ 12.5 mg daily -Continue lifestyle modifications   F/u prn  Grier Mitts, MD

## 2022-04-25 ENCOUNTER — Encounter: Payer: 59 | Admitting: Family Medicine

## 2022-05-04 ENCOUNTER — Encounter: Payer: Self-pay | Admitting: Family Medicine

## 2022-05-04 ENCOUNTER — Ambulatory Visit (INDEPENDENT_AMBULATORY_CARE_PROVIDER_SITE_OTHER): Payer: 59 | Admitting: Family Medicine

## 2022-05-04 VITALS — BP 110/80 | HR 73 | Temp 97.9°F | Ht 62.0 in | Wt 165.0 lb

## 2022-05-04 DIAGNOSIS — K5900 Constipation, unspecified: Secondary | ICD-10-CM | POA: Diagnosis not present

## 2022-05-04 DIAGNOSIS — Z1211 Encounter for screening for malignant neoplasm of colon: Secondary | ICD-10-CM

## 2022-05-04 DIAGNOSIS — I10 Essential (primary) hypertension: Secondary | ICD-10-CM | POA: Diagnosis not present

## 2022-05-04 DIAGNOSIS — Z0000 Encounter for general adult medical examination without abnormal findings: Secondary | ICD-10-CM

## 2022-05-04 LAB — TSH: TSH: 1.59 u[IU]/mL (ref 0.35–5.50)

## 2022-05-04 LAB — CBC WITH DIFFERENTIAL/PLATELET
Basophils Absolute: 0.1 10*3/uL (ref 0.0–0.1)
Basophils Relative: 0.9 % (ref 0.0–3.0)
Eosinophils Absolute: 0.2 10*3/uL (ref 0.0–0.7)
Eosinophils Relative: 2.6 % (ref 0.0–5.0)
HCT: 41.4 % (ref 36.0–46.0)
Hemoglobin: 13.5 g/dL (ref 12.0–15.0)
Lymphocytes Relative: 28.2 % (ref 12.0–46.0)
Lymphs Abs: 2.2 10*3/uL (ref 0.7–4.0)
MCHC: 32.6 g/dL (ref 30.0–36.0)
MCV: 84.9 fl (ref 78.0–100.0)
Monocytes Absolute: 0.6 10*3/uL (ref 0.1–1.0)
Monocytes Relative: 7.4 % (ref 3.0–12.0)
Neutro Abs: 4.7 10*3/uL (ref 1.4–7.7)
Neutrophils Relative %: 60.9 % (ref 43.0–77.0)
Platelets: 301 10*3/uL (ref 150.0–400.0)
RBC: 4.88 Mil/uL (ref 3.87–5.11)
RDW: 14.4 % (ref 11.5–15.5)
WBC: 7.8 10*3/uL (ref 4.0–10.5)

## 2022-05-04 LAB — LIPID PANEL
Cholesterol: 156 mg/dL (ref 0–200)
HDL: 57.7 mg/dL (ref >39.00)
LDL Cholesterol: 81 mg/dL (ref 0–99)
NonHDL: 97.91
Total CHOL/HDL Ratio: 3
Triglycerides: 86 mg/dL (ref 0.0–149.0)
VLDL: 17.2 mg/dL (ref 0.0–40.0)

## 2022-05-04 LAB — T4, FREE: Free T4: 0.79 ng/dL (ref 0.60–1.60)

## 2022-05-04 LAB — BASIC METABOLIC PANEL
BUN: 18 mg/dL (ref 6–23)
CO2: 29 mEq/L (ref 19–32)
Calcium: 10.1 mg/dL (ref 8.4–10.5)
Chloride: 101 mEq/L (ref 96–112)
Creatinine, Ser: 0.51 mg/dL (ref 0.40–1.20)
GFR: 103.46 mL/min (ref >60.00)
Glucose, Bld: 98 mg/dL (ref 70–99)
Potassium: 4.4 mEq/L (ref 3.5–5.1)
Sodium: 137 mEq/L (ref 135–145)

## 2022-05-04 LAB — HEMOGLOBIN A1C: Hgb A1c MFr Bld: 6.1 % (ref 4.6–6.5)

## 2022-05-04 NOTE — Patient Instructions (Signed)
You have a years worth of refills on your blood pressure medication.  Contact your pharmacy to have the medication refilled.

## 2022-05-04 NOTE — Progress Notes (Signed)
Subjective:     [name] is a 56 y.o. female and is here for a comprehensive physical exam. The patient reports doing well.  Pt working on diet.  Inquires how many sweets she can eat in a day.  Pt states she is trying not to eat fried foods often.  Pt wants to know what she can do to get rid of lower abdominal fat and reduce breast.  Pt endorses intermittent constipation, having to strain.  Will drink prune juice to help with symptoms.  Has been drinking less water and less vegetables.  Pt request refill on HCTZ 12.5 mg.  Wants to be able to stop taking bp med.  Had flu shot at work.  Social History   Socioeconomic History   Marital status: Single    Spouse name: Not on file   Number of children: Not on file   Years of education: Not on file   Highest education level: Not on file  Occupational History   Not on file  Tobacco Use   Smoking status: Former   Smokeless tobacco: Never  Vaping Use   Vaping Use: Never used  Substance and Sexual Activity   Alcohol use: Yes    Comment: occass.    Drug use: No   Sexual activity: Yes  Other Topics Concern   Not on file  Social History Narrative   Not on file   Social Determinants of Health   Financial Resource Strain: Not on file  Food Insecurity: Not on file  Transportation Needs: Not on file  Physical Activity: Not on file  Stress: Not on file  Social Connections: Not on file  Intimate Partner Violence: Not on file   Health Maintenance  Topic Date Due   HIV Screening  Never done   COVID-19 Vaccine (3 - Mixed Product risk series) 05/20/2022 (Originally 11/29/2019)   PAP SMEAR-Modifier  08/04/2022 (Originally 12/06/2021)   Zoster Vaccines- Shingrix (2 of 2) 08/04/2022 (Originally 02/21/2022)   COLONOSCOPY (Pts 45-73yr Insurance coverage will need to be confirmed)  11/03/2022 (Originally 09/10/2009)   MAMMOGRAM  01/28/2024   TETANUS/TDAP  01/08/2028   INFLUENZA VACCINE  Completed   Hepatitis C Screening  Completed   HPV  VACCINES  Aged Out    The following portions of the patient's history were reviewed and updated as appropriate: allergies, current medications, past family history, past medical history, past social history, past surgical history, and problem list.  Review of Systems Pertinent items noted in HPI and remainder of comprehensive ROS otherwise negative.   Objective:    BP 110/80 (BP Location: Left Arm, Patient Position: Sitting, Cuff Size: Normal)   Pulse 73   Temp 97.9 F (36.6 C) (Oral)   Ht '5\' 2"'$  (1.575 m)   Wt 165 lb (74.8 kg)   LMP 06/13/2018   SpO2 99%   BMI 30.18 kg/m  General appearance: alert, cooperative, and no distress Head: Normocephalic, without obvious abnormality, atraumatic Eyes: conjunctivae/corneas clear. PERRL, EOM's intact. Fundi benign. Ears: normal TM's and external ear canals both ears Nose: Nares normal. Septum midline. Mucosa normal. No drainage or sinus tenderness. Throat: lips, mucosa, and tongue normal; teeth and gums normal Neck: no adenopathy, no carotid bruit, no JVD, supple, symmetrical, trachea midline, and thyroid not enlarged, symmetric, no tenderness/mass/nodules Lungs: clear to auscultation bilaterally Heart: regular rate and rhythm, S1, S2 normal, no murmur, click, rub or gallop Abdomen: soft, non-tender; bowel sounds normal; no masses,  no organomegaly Extremities: extremities normal, atraumatic, no cyanosis  or edema Pulses: 2+ and symmetric Skin: Skin color, texture, turgor normal. No rashes or lesions Lymph nodes: Cervical, supraclavicular, and axillary nodes normal. Neurologic: Alert and oriented X 3, normal strength and tone. Normal symmetric reflexes. Normal coordination and gait    Assessment:    Healthy female exam.      Plan:    Anticipatory guidance given including wearing seatbelts, smoke detectors in the home, increasing physical activity, increasing p.o. intake of water and vegetables. -labs -mammogram done  01/27/22 -Colonoscopy due.  Referral placed this visit. -Pap up-to-date done 12/29/2021 with OB/GYN -Immunizations reviewed.  Influenza vaccine up-to-date -Given handout -Next CPE in 1 year See After Visit Summary for Counseling Recommendations  Well adult exam - Plan: Hemoglobin A1c, Lipid panel  Screening for malignant neoplasm of colon  - Plan: Ambulatory referral to Gastroenterology  Essential hypertension  -controlled -Continue hydrochlorothiazide 12.5 mg daily -Continue lifestyle modifications - Plan: CBC with Differential/Platelet, Basic metabolic panel, TSH, T4, Free  Constipation, unspecified constipation type  -Discussed dietary changes including increasing p.o. intake of water -Consider OTC MiraLAX daily as needed -Refer to gastroenterology for continued or worsening symptoms. - Plan: Basic metabolic panel, TSH, T4, Free  Follow-up as needed  Grier Mitts, MD

## 2022-05-06 ENCOUNTER — Encounter: Payer: Self-pay | Admitting: Family Medicine

## 2022-05-10 ENCOUNTER — Telehealth: Payer: Self-pay | Admitting: Family Medicine

## 2022-05-10 NOTE — Telephone Encounter (Signed)
Pt called to say she was recently told she is pre-diabetic and she has questions for the MD.  Pt would like a call back for advice.

## 2022-05-11 NOTE — Telephone Encounter (Signed)
Pt will be here Wednesday 05/18/22

## 2022-05-16 ENCOUNTER — Other Ambulatory Visit (HOSPITAL_COMMUNITY): Payer: Self-pay

## 2022-05-18 ENCOUNTER — Ambulatory Visit: Payer: 59 | Admitting: Family Medicine

## 2022-05-18 ENCOUNTER — Telehealth: Payer: Self-pay | Admitting: Family Medicine

## 2022-05-18 NOTE — Telephone Encounter (Signed)
Pt called to request a call back to ask a question. Pt would not disclose to me the nature of the call.

## 2022-05-19 NOTE — Telephone Encounter (Signed)
Spoke with pt, just wanted to know about the visit that she had scheduled on 05/20/22, checked her cart and informed her it was not needed. Canceled the visit.

## 2022-05-20 ENCOUNTER — Ambulatory Visit: Payer: 59 | Admitting: Family Medicine

## 2022-07-02 DIAGNOSIS — U071 COVID-19: Secondary | ICD-10-CM | POA: Diagnosis not present

## 2022-07-02 DIAGNOSIS — R051 Acute cough: Secondary | ICD-10-CM | POA: Diagnosis not present

## 2022-08-17 ENCOUNTER — Ambulatory Visit: Payer: Commercial Managed Care - PPO | Admitting: Family Medicine

## 2022-08-24 ENCOUNTER — Ambulatory Visit: Payer: Commercial Managed Care - PPO | Admitting: Family Medicine

## 2022-09-01 ENCOUNTER — Ambulatory Visit: Payer: Commercial Managed Care - PPO | Admitting: Family Medicine

## 2022-09-02 ENCOUNTER — Telehealth: Payer: Self-pay | Admitting: Family Medicine

## 2022-09-02 NOTE — Telephone Encounter (Signed)
Patient calling regarding her blood pressure pills hydrochlorothiazide (MICROZIDE) 12.5 MG capsule , says the ones she received are different from the ones she previously has taken. Explained the shape of the medication may change depending on the manufacturer.

## 2022-09-05 NOTE — Telephone Encounter (Signed)
Can someone please call the pt back and see what is going on.

## 2022-09-05 NOTE — Telephone Encounter (Signed)
Pt called again, stating she is still waiting for a call back from MD or CMA. Pt was asked if call was regarding a medication refill, as I could help with that.   Pt stated it was for something else and would rather speak directly to MD or CMA. Please return Pt's call at your earliest convenience.

## 2022-09-05 NOTE — Telephone Encounter (Signed)
Pt is calling back and stated she is still waiting for a call back about the medication .

## 2022-09-06 ENCOUNTER — Other Ambulatory Visit (HOSPITAL_COMMUNITY): Payer: Self-pay

## 2022-09-06 ENCOUNTER — Other Ambulatory Visit: Payer: Self-pay

## 2022-09-06 NOTE — Telephone Encounter (Signed)
Left a message for the patient to return my call.  

## 2022-09-06 NOTE — Telephone Encounter (Signed)
I spoke with the patient and she had concerns regarding her Hydrochlorothiazide medication. Patient reported that she received a different pill than usual with her most recent refill. I spoke with Jinny Blossom at [facility] and she stated there was a change in manufacturer which is why the pills were changed. I informed the patient of this and informed her to contact her pharmacy for any additional questions.

## 2022-09-09 ENCOUNTER — Ambulatory Visit: Payer: 59 | Admitting: Family Medicine

## 2022-09-12 ENCOUNTER — Ambulatory Visit: Payer: 59 | Admitting: Family Medicine

## 2022-09-22 ENCOUNTER — Ambulatory Visit: Payer: 59 | Admitting: Family Medicine

## 2022-09-26 ENCOUNTER — Ambulatory Visit: Payer: 59 | Admitting: Family Medicine

## 2022-10-24 ENCOUNTER — Other Ambulatory Visit: Payer: Self-pay | Admitting: Obstetrics and Gynecology

## 2022-10-24 DIAGNOSIS — Z1231 Encounter for screening mammogram for malignant neoplasm of breast: Secondary | ICD-10-CM

## 2022-10-27 ENCOUNTER — Other Ambulatory Visit (HOSPITAL_COMMUNITY): Payer: Self-pay

## 2022-11-02 ENCOUNTER — Encounter: Payer: Self-pay | Admitting: Family Medicine

## 2022-11-02 ENCOUNTER — Ambulatory Visit: Payer: 59 | Admitting: Family Medicine

## 2022-11-02 VITALS — BP 124/80 | HR 72 | Temp 98.2°F | Wt 170.4 lb

## 2022-11-02 DIAGNOSIS — L858 Other specified epidermal thickening: Secondary | ICD-10-CM | POA: Diagnosis not present

## 2022-11-02 DIAGNOSIS — Z0189 Encounter for other specified special examinations: Secondary | ICD-10-CM

## 2022-11-02 DIAGNOSIS — R7303 Prediabetes: Secondary | ICD-10-CM

## 2022-11-02 DIAGNOSIS — I10 Essential (primary) hypertension: Secondary | ICD-10-CM | POA: Diagnosis not present

## 2022-11-02 LAB — GLUCOSE, POCT (MANUAL RESULT ENTRY): POC Glucose: 5.6 mg/dl — AB (ref 70–99)

## 2022-11-02 NOTE — Progress Notes (Signed)
Established Patient Office Visit   Subjective  Patient ID: [name], female    DOB: 1965/11/11  Age: 57 y.o. MRN: [id number]  Chief Complaint  Patient presents with   Follow-up    Pt is concerned about being pre-diabetic, as her A1c was 6.1% in October     Pt is a 57 yo female with pmh sig for HTN, pre DM, OA who was seen for f/u.  Pt concerned about bs.  States has stopped drinking that many sodas, not eating as many sweets, is not adding salt to food, and drinking more water.  Pt states she lost wt, but our scales do not reflect this.  Pt notes clothes fitting looser.  Pt notes bumps on back of arms and legs, does not like the appearance of them.  They are non pruritus.  States her mom and sister have similar bumps.  Patient made an appointment with dermatology.  Pt inquires if she can get off bp meds.  Taking hczt 12.5 mg.    Patient Active Problem List   Diagnosis Date Noted   Essential hypertension 04/15/2021   Bunion, right foot 04/15/2021   Flat feet, bilateral 04/15/2021   Primary osteoarthritis of right knee 12/14/2020   Class 1 obesity due to excess calories with body mass index (BMI) of 31.0 to 31.9 in adult 12/14/2020   Fibroadenoma of left breast 12/23/2019   Past Surgical History:  Procedure Laterality Date   MYOMECTOMY ABDOMINAL APPROACH     TONSILLECTOMY     Social History   Tobacco Use   Smoking status: Former   Smokeless tobacco: Never  Vaping Use   Vaping Use: Never used  Substance Use Topics   Alcohol use: Yes    Comment: occass.    Drug use: No   Family History  Problem Relation Age of Onset   Diabetes Mother    Hypertension Mother    Diabetes Father    Hypertension Father    Allergies  Allergen Reactions   Penicillins Hives    ROS Negative unless stated above    Objective:     BP 124/80 (BP Location: Left Arm, Patient Position: Sitting, Cuff Size: Normal)   Pulse 72   Temp 98.2 F (36.8 C) (Oral)   Wt 170 lb 6.4 oz (77.3 kg)    LMP 06/13/2018   SpO2 94%   BMI 31.17 kg/m  BP Readings from Last 3 Encounters:  11/02/22 124/80  05/04/22 110/80  04/11/22 122/84   Wt Readings from Last 3 Encounters:  11/02/22 170 lb 6.4 oz (77.3 kg)  05/04/22 165 lb (74.8 kg)  04/11/22 163 lb 9.6 oz (74.2 kg)      Physical Exam Constitutional:      General: She is not in acute distress.    Appearance: Normal appearance.  HENT:     Head: Normocephalic and atraumatic.     Nose: Nose normal.     Mouth/Throat:     Mouth: Mucous membranes are moist.  Cardiovascular:     Rate and Rhythm: Normal rate and regular rhythm.     Heart sounds: Normal heart sounds. No murmur heard.    No gallop.  Pulmonary:     Effort: Pulmonary effort is normal. No respiratory distress.     Breath sounds: Normal breath sounds. No wheezing, rhonchi or rales.  Skin:    General: Skin is warm and dry.     Findings: Rash present. Rash is papular.     Comments: Hyperpigmented  raised, rough papules at hair folicles, no erythema or pustules on b/l posterior upper arms and b/l LEs.  Neurological:     Mental Status: She is alert and oriented to person, place, and time.      Results for orders placed or performed in visit on 11/02/22  POC Glucose (CBG)  Result Value Ref Range   POC Glucose 5.6 (A) 70 - 99 mg/dl      Assessment & Plan:  Essential hypertension -controlled -continue HCTZ 12.5 mg and lifestyle modifications  Prediabetes -Hemoglobin A1c 6.1% on 02/01/2022 -Hemoglobin A1c this visit 5.6% -Continue lifestyle modifications, continue to monitor -     POCT glucose (manual entry)  Patient request for diagnostic testing -     POCT glucose (manual entry)  Keratosis pilaris -Given information -Continue to follow-up with dermatology  Return in about 6 months (around 05/04/2023).   Deeann Saint, MD

## 2022-12-23 ENCOUNTER — Other Ambulatory Visit (HOSPITAL_COMMUNITY): Payer: Self-pay

## 2022-12-23 DIAGNOSIS — W57XXXA Bitten or stung by nonvenomous insect and other nonvenomous arthropods, initial encounter: Secondary | ICD-10-CM | POA: Diagnosis not present

## 2022-12-23 DIAGNOSIS — S90862A Insect bite (nonvenomous), left foot, initial encounter: Secondary | ICD-10-CM | POA: Diagnosis not present

## 2022-12-23 MED ORDER — MUPIROCIN 2 % EX OINT
TOPICAL_OINTMENT | Freq: Three times a day (TID) | CUTANEOUS | 0 refills | Status: DC
  Filled 2022-12-23: qty 22, 7d supply, fill #0

## 2023-01-02 ENCOUNTER — Encounter: Payer: Self-pay | Admitting: Family Medicine

## 2023-01-02 DIAGNOSIS — Z01419 Encounter for gynecological examination (general) (routine) without abnormal findings: Secondary | ICD-10-CM | POA: Diagnosis not present

## 2023-01-02 LAB — HM PAP SMEAR: HM Pap smear: NORMAL

## 2023-01-09 ENCOUNTER — Telehealth: Payer: Self-pay | Admitting: Family Medicine

## 2023-01-09 ENCOUNTER — Other Ambulatory Visit (HOSPITAL_COMMUNITY): Payer: Self-pay

## 2023-01-09 ENCOUNTER — Other Ambulatory Visit: Payer: Self-pay | Admitting: Family Medicine

## 2023-01-09 DIAGNOSIS — I10 Essential (primary) hypertension: Secondary | ICD-10-CM

## 2023-01-09 MED ORDER — HYDROCHLOROTHIAZIDE 12.5 MG PO CAPS
12.5000 mg | ORAL_CAPSULE | Freq: Every day | ORAL | 3 refills | Status: DC
  Filled 2023-01-09: qty 90, 90d supply, fill #0
  Filled 2023-04-04: qty 90, 90d supply, fill #1

## 2023-01-09 NOTE — Telephone Encounter (Signed)
Prescription Request  01/09/2023  LOV: 11/02/2022  What is the name of the medication or equipment?     hydrochlorothiazide (MICROZIDE) 12.5 MG capsule  Have you contacted your pharmacy to request a refill? No   Which pharmacy would you like this sent to?  [facility] [address] [place] [address] Phone: [phone] Fax: [phone]    Patient notified that their request is being sent to the clinical staff for review and that they should receive a response within 2 business days.   Please advise at Mobile [phone] (mobile)

## 2023-01-09 NOTE — Telephone Encounter (Signed)
Rx sent in

## 2023-01-10 ENCOUNTER — Other Ambulatory Visit (HOSPITAL_COMMUNITY): Payer: Self-pay

## 2023-01-30 ENCOUNTER — Ambulatory Visit
Admission: RE | Admit: 2023-01-30 | Discharge: 2023-01-30 | Disposition: A | Discharge status: Home / Self Care | Payer: 59 | Source: Ambulatory Visit | Attending: Obstetrics and Gynecology | Admitting: Obstetrics and Gynecology

## 2023-01-30 DIAGNOSIS — Z1231 Encounter for screening mammogram for malignant neoplasm of breast: Secondary | ICD-10-CM

## 2023-03-20 ENCOUNTER — Encounter: Payer: Self-pay | Admitting: Gastroenterology

## 2023-03-21 ENCOUNTER — Encounter: Payer: Self-pay | Admitting: Gastroenterology

## 2023-04-04 ENCOUNTER — Other Ambulatory Visit (HOSPITAL_COMMUNITY): Payer: Self-pay

## 2023-04-07 ENCOUNTER — Encounter: Payer: 59 | Admitting: Gastroenterology

## 2023-05-15 ENCOUNTER — Encounter: Payer: 59 | Admitting: Family Medicine

## 2023-05-18 ENCOUNTER — Encounter: Payer: 59 | Admitting: Family Medicine

## 2023-05-25 ENCOUNTER — Encounter: Payer: 59 | Admitting: Family Medicine

## 2023-05-31 ENCOUNTER — Encounter: Payer: 59 | Admitting: Family Medicine

## 2023-06-02 ENCOUNTER — Encounter: Payer: 59 | Admitting: Gastroenterology

## 2023-06-12 ENCOUNTER — Encounter: Payer: Self-pay | Admitting: Family Medicine

## 2023-06-12 ENCOUNTER — Other Ambulatory Visit (HOSPITAL_COMMUNITY): Payer: Self-pay

## 2023-06-12 ENCOUNTER — Ambulatory Visit: Payer: 59 | Admitting: Family Medicine

## 2023-06-12 VITALS — BP 150/90 | HR 67 | Temp 98.4°F | Ht 62.0 in | Wt 166.0 lb

## 2023-06-12 DIAGNOSIS — Z1211 Encounter for screening for malignant neoplasm of colon: Secondary | ICD-10-CM

## 2023-06-12 DIAGNOSIS — R7303 Prediabetes: Secondary | ICD-10-CM

## 2023-06-12 DIAGNOSIS — Z0000 Encounter for general adult medical examination without abnormal findings: Secondary | ICD-10-CM

## 2023-06-12 DIAGNOSIS — I10 Essential (primary) hypertension: Secondary | ICD-10-CM | POA: Diagnosis not present

## 2023-06-12 DIAGNOSIS — L858 Other specified epidermal thickening: Secondary | ICD-10-CM

## 2023-06-12 LAB — CBC WITH DIFFERENTIAL/PLATELET
Basophils Absolute: 0.1 10*3/uL (ref 0.0–0.1)
Basophils Relative: 0.8 % (ref 0.0–3.0)
Eosinophils Absolute: 0.2 10*3/uL (ref 0.0–0.7)
Eosinophils Relative: 2.6 % (ref 0.0–5.0)
HCT: 44.4 % (ref 36.0–46.0)
Hemoglobin: 14.4 g/dL (ref 12.0–15.0)
Lymphocytes Relative: 29.9 % (ref 12.0–46.0)
Lymphs Abs: 2.2 10*3/uL (ref 0.7–4.0)
MCHC: 32.5 g/dL (ref 30.0–36.0)
MCV: 85.5 fL (ref 78.0–100.0)
Monocytes Absolute: 0.5 10*3/uL (ref 0.1–1.0)
Monocytes Relative: 6.2 % (ref 3.0–12.0)
Neutro Abs: 4.5 10*3/uL (ref 1.4–7.7)
Neutrophils Relative %: 60.5 % (ref 43.0–77.0)
Platelets: 314 10*3/uL (ref 150.0–400.0)
RBC: 5.19 Mil/uL — ABNORMAL HIGH (ref 3.87–5.11)
RDW: 14.3 % (ref 11.5–15.5)
WBC: 7.5 10*3/uL (ref 4.0–10.5)

## 2023-06-12 LAB — COMPREHENSIVE METABOLIC PANEL
ALT: 47 U/L — ABNORMAL HIGH (ref 0–35)
AST: 25 U/L (ref 0–37)
Albumin: 4.6 g/dL (ref 3.5–5.2)
Alkaline Phosphatase: 106 U/L (ref 39–117)
BUN: 17 mg/dL (ref 6–23)
CO2: 32 meq/L (ref 19–32)
Calcium: 9.9 mg/dL (ref 8.4–10.5)
Chloride: 99 meq/L (ref 96–112)
Creatinine, Ser: 0.5 mg/dL (ref 0.40–1.20)
GFR: 103.15 mL/min (ref >60.00)
Glucose, Bld: 87 mg/dL (ref 70–99)
Potassium: 4 meq/L (ref 3.5–5.1)
Sodium: 140 meq/L (ref 135–145)
Total Bilirubin: 0.4 mg/dL (ref 0.2–1.2)
Total Protein: 7.9 g/dL (ref 6.0–8.3)

## 2023-06-12 LAB — LIPID PANEL
Cholesterol: 173 mg/dL (ref 0–200)
HDL: 51.6 mg/dL (ref >39.00)
LDL Cholesterol: 99 mg/dL (ref 0–99)
NonHDL: 121.07
Total CHOL/HDL Ratio: 3
Triglycerides: 110 mg/dL (ref 0.0–149.0)
VLDL: 22 mg/dL (ref 0.0–40.0)

## 2023-06-12 LAB — HEMOGLOBIN A1C: Hgb A1c MFr Bld: 6.1 % (ref 4.6–6.5)

## 2023-06-12 LAB — T4, FREE: Free T4: 0.79 ng/dL (ref 0.60–1.60)

## 2023-06-12 LAB — TSH: TSH: 2.58 u[IU]/mL (ref 0.35–5.50)

## 2023-06-12 MED ORDER — HYDROCHLOROTHIAZIDE 12.5 MG PO CAPS
12.5000 mg | ORAL_CAPSULE | Freq: Every day | ORAL | 3 refills | Status: DC
  Filled 2023-06-12 – 2023-07-18 (×2): qty 90, 90d supply, fill #0
  Filled 2023-10-04: qty 90, 90d supply, fill #1
  Filled 2023-12-25: qty 90, 90d supply, fill #2
  Filled 2024-03-24: qty 90, 90d supply, fill #3

## 2023-06-12 NOTE — Progress Notes (Signed)
Established Patient Office Visit   Subjective  Patient ID: [name], female    DOB: Sep 16, 1965  Age: 57 y.o. MRN: [id number]  Chief Complaint  Patient presents with   Annual Exam    Patient is a 57 year old female seen for CPE.  Patient states blood pressure high due to having ham and other salty foods at Thanksgiving.  Patient taking HCTZ 12.5 mg daily.  Requesting refill.  Patient mentions dark bumps on upper arms.  At times mildly pruritic.  Patient states other family members have similar skin.  Mammogram done 01/30/2023.  Pap done 01/02/2023   Patient Active Problem List   Diagnosis Date Noted   Essential hypertension 04/15/2021   Bunion, right foot 04/15/2021   Flat feet, bilateral 04/15/2021   Primary osteoarthritis of right knee 12/14/2020   Class 1 obesity due to excess calories with body mass index (BMI) of 31.0 to 31.9 in adult 12/14/2020   Fibroadenoma of left breast 12/23/2019   Past Medical History:  Diagnosis Date   Fibroids    Past Surgical History:  Procedure Laterality Date   MYOMECTOMY ABDOMINAL APPROACH     TONSILLECTOMY     Social History   Tobacco Use   Smoking status: Former   Smokeless tobacco: Never  Vaping Use   Vaping status: Never Used  Substance Use Topics   Alcohol use: Yes    Comment: occass.    Drug use: No   Family History  Problem Relation Age of Onset   Diabetes Mother    Hypertension Mother    Diabetes Father    Hypertension Father    Allergies  Allergen Reactions   Penicillins Hives      ROS Negative unless stated above    Objective:     BP (!) 150/90 (BP Location: Left Arm, Patient Position: Sitting, Cuff Size: Normal)   Pulse 67   Temp 98.4 F (36.9 C) (Oral)   Ht 5\' 2"  (1.575 m)   Wt 166 lb (75.3 kg)   LMP 06/13/2018   SpO2 95%   BMI 30.36 kg/m  BP Readings from Last 3 Encounters:  06/12/23 (!) 150/90  11/02/22 124/80  05/04/22 110/80   Wt Readings from Last 3 Encounters:  06/12/23 166 lb (75.3  kg)  11/02/22 170 lb 6.4 oz (77.3 kg)  05/04/22 165 lb (74.8 kg)      Physical Exam Constitutional:      Appearance: Normal appearance.  HENT:     Head: Normocephalic and atraumatic.     Right Ear: Tympanic membrane, ear canal and external ear normal.     Left Ear: Tympanic membrane, ear canal and external ear normal.     Nose: Nose normal.     Mouth/Throat:     Mouth: Mucous membranes are moist.     Pharynx: No oropharyngeal exudate or posterior oropharyngeal erythema.  Eyes:     General: No scleral icterus.    Extraocular Movements: Extraocular movements intact.     Conjunctiva/sclera: Conjunctivae normal.     Pupils: Pupils are equal, round, and reactive to light.  Neck:     Thyroid: No thyromegaly.  Cardiovascular:     Rate and Rhythm: Normal rate and regular rhythm.     Pulses: Normal pulses.     Heart sounds: Normal heart sounds. No murmur heard.    No friction rub.  Pulmonary:     Effort: Pulmonary effort is normal.     Breath sounds: Normal breath sounds. No wheezing, rhonchi  or rales.  Abdominal:     General: Bowel sounds are normal.     Palpations: Abdomen is soft.     Tenderness: There is no abdominal tenderness.  Musculoskeletal:        General: No deformity. Normal range of motion.  Lymphadenopathy:     Cervical: No cervical adenopathy.  Skin:    General: Skin is warm and dry.     Findings: No lesion.     Comments: Skin of bilateral upper arms with numerous fine slightly raised 2-3 mm papules.  Neurological:     General: No focal deficit present.     Mental Status: She is alert and oriented to person, place, and time.  Psychiatric:        Mood and Affect: Mood normal.        Thought Content: Thought content normal.    No results found for any visits on 06/12/23.    Assessment & Plan:  Well adult exam -Age-appropriate health screenings discussed -Obtain labs -Immunizations reviewed. -Colonoscopy recommended -Mammogram done 01/30/2023 -Pap done  01/02/2023 -Next CPE in 1 year -     Lipid panel; Future  Essential hypertension -elevated, previously controlled -Recheck.  Likely elevated 2/2 increased sodium intake -Patient advised to monitor BP at home.  For elevations consistently greater than 140/90 add additional medication -     CBC with Differential/Platelet; Future -     Comprehensive metabolic panel; Future -     TSH; Future -     hydroCHLOROthiazide; Take 1 capsule (12.5 mg total) by mouth daily.  Dispense: 90 capsule; Refill: 3 -     T4, free  Prediabetes -Hemoglobin A1c 6.1% on 05/04/2022 -Lifestyle modifications -     Hemoglobin A1c; Future  Screen for colon cancer -     Ambulatory referral to Gastroenterology  Keratosis pilaris -Discussed treatment options  Return in about 3 months (around 09/10/2023) for blood pressure, prediabetes.   Deeann Saint, MD

## 2023-06-15 ENCOUNTER — Telehealth: Payer: Self-pay | Admitting: Family Medicine

## 2023-06-15 NOTE — Telephone Encounter (Signed)
Pt called, requesting to speak to CMA, regarding lab results. CMA was unavailable. Pt asked that CMA call back at her earliest convenience.

## 2023-06-15 NOTE — Telephone Encounter (Signed)
Spoke with patient, went over labs patient verbalized understanding, patient is sch to come back in 3 months

## 2023-07-12 ENCOUNTER — Encounter (HOSPITAL_COMMUNITY): Payer: Self-pay

## 2023-07-12 ENCOUNTER — Ambulatory Visit (HOSPITAL_COMMUNITY)
Admission: EM | Admit: 2023-07-12 | Discharge: 2023-07-12 | Disposition: A | Discharge status: Home / Self Care | Payer: 59 | Attending: Physician Assistant | Admitting: Physician Assistant

## 2023-07-12 DIAGNOSIS — J069 Acute upper respiratory infection, unspecified: Secondary | ICD-10-CM

## 2023-07-12 LAB — POC COVID19/FLU A&B COMBO
Covid Antigen, POC: NEGATIVE
Influenza A Antigen, POC: NEGATIVE
Influenza B Antigen, POC: NEGATIVE

## 2023-07-12 MED ORDER — PROMETHAZINE-DM 6.25-15 MG/5ML PO SYRP
5.0000 mL | ORAL_SOLUTION | Freq: Four times a day (QID) | ORAL | 0 refills | Status: DC | PRN
  Filled 2023-07-12: qty 118, 6d supply, fill #0

## 2023-07-12 MED ORDER — BENZONATATE 100 MG PO CAPS
100.0000 mg | ORAL_CAPSULE | Freq: Three times a day (TID) | ORAL | 0 refills | Status: DC
Start: 2023-07-12 — End: 2023-08-28
  Filled 2023-07-12: qty 21, 7d supply, fill #0

## 2023-07-12 MED ORDER — FLUTICASONE PROPIONATE 50 MCG/ACT NA SUSP
1.0000 | Freq: Every day | NASAL | 2 refills | Status: DC
  Filled 2023-07-12: qty 16, 60d supply, fill #0

## 2023-07-12 NOTE — ED Provider Notes (Signed)
 [facility]    CSN: [id number] Arrival date & time: 07/12/23  1051      History   Chief Complaint Chief Complaint  Patient presents with   Cough    HPI [name] is a 58 y.o. female.   Patient complains of cough and congestion that started about 3 days ago.  She reports she has been taking Mucinex and DayQuil with minimal relief.  She reports cough is worse at night.  Reports some wheezing that occurs only at bedtime.  She does not have a history of asthma or lung disease.  Denies fever, chills, shortness of breath.  Reports her sister was sick with similar symptoms last week.    Past Medical History:  Diagnosis Date   Fibroids     Patient Active Problem List   Diagnosis Date Noted   Essential hypertension 04/15/2021   Bunion, right foot 04/15/2021   Flat feet, bilateral 04/15/2021   Primary osteoarthritis of right knee 12/14/2020   Class 1 obesity due to excess calories with body mass index (BMI) of 31.0 to 31.9 in adult 12/14/2020   Fibroadenoma of left breast 12/23/2019    Past Surgical History:  Procedure Laterality Date   MYOMECTOMY ABDOMINAL APPROACH     TONSILLECTOMY      OB History     Gravida  0   Para      Term      Preterm      AB      Living         SAB      IAB      Ectopic      Multiple      Live Births               Home Medications    Prior to Admission medications   Medication Sig Start Date End Date Taking? Authorizing Provider  benzonatate  (TESSALON ) 100 MG capsule Take 1 capsule (100 mg total) by mouth every 8 (eight) hours. 07/12/23  Yes Ward, Harlene PEDLAR, PA-C  fluticasone  (FLONASE ) 50 MCG/ACT nasal spray Place 1 spray into both nostrils daily. 07/12/23  Yes Ward, Harlene PEDLAR, PA-C  promethazine -dextromethorphan (PROMETHAZINE -DM) 6.25-15 MG/5ML syrup Take 5 mLs by mouth 4 (four) times daily as needed for cough. 07/12/23  Yes Ward, Harlene PEDLAR, PA-C  hydrochlorothiazide  (MICROZIDE ) 12.5 MG capsule Take 1  capsule (12.5 mg total) by mouth daily. 06/12/23   Mercer Clotilda SAUNDERS, MD  loratadine-pseudoephedrine (CLARITIN-D 12 HOUR) 5-120 MG tablet Take by mouth. 07/02/22   [provider]    Family History Family History  Problem Relation Age of Onset   Diabetes Mother    Hypertension Mother    Diabetes Father    Hypertension Father     Social History Social History   Tobacco Use   Smoking status: Former   Smokeless tobacco: Never  Vaping Use   Vaping status: Never Used  Substance Use Topics   Alcohol use: Yes    Comment: occass.    Drug use: No     Allergies   Penicillins   Review of Systems Review of Systems  Constitutional:  Negative for chills and fever.  HENT:  Positive for congestion and postnasal drip. Negative for ear pain and sore throat.   Eyes:  Negative for pain and visual disturbance.  Respiratory:  Positive for cough. Negative for shortness of breath.   Cardiovascular:  Negative for chest pain and palpitations.  Gastrointestinal:  Negative for abdominal pain  and vomiting.  Genitourinary:  Negative for dysuria and hematuria.  Musculoskeletal:  Negative for arthralgias and back pain.  Skin:  Negative for color change and rash.  Neurological:  Negative for seizures and syncope.  All other systems reviewed and are negative.    Physical Exam Triage Vital Signs ED Triage Vitals  Encounter Vitals Group     BP 07/12/23 1111 (!) 154/77     Systolic BP Percentile --      Diastolic BP Percentile --      Pulse Rate 07/12/23 1109 81     Resp 07/12/23 1109 16     Temp 07/12/23 1109 98 F (36.7 C)     Temp Source 07/12/23 1109 Oral     SpO2 07/12/23 1109 97 %     Weight --      Height --      Head Circumference --      Peak Flow --      Pain Score 07/12/23 1110 0     Pain Loc --      Pain Education --      Exclude from Growth Chart --    No data found.  Updated Vital Signs BP (!) 154/77 (BP Location: Left Arm)   Pulse 81   Temp 98 F (36.7 C)  (Oral)   Resp 16   LMP 06/13/2018   SpO2 97%   Visual Acuity Right Eye Distance:   Left Eye Distance:   Bilateral Distance:    Right Eye Near:   Left Eye Near:    Bilateral Near:     Physical Exam Vitals and nursing note reviewed.  Constitutional:      General: She is not in acute distress.    Appearance: She is well-developed.  HENT:     Head: Normocephalic and atraumatic.  Eyes:     Conjunctiva/sclera: Conjunctivae normal.  Cardiovascular:     Rate and Rhythm: Normal rate and regular rhythm.     Heart sounds: No murmur heard. Pulmonary:     Effort: Pulmonary effort is normal. No respiratory distress.     Breath sounds: Normal breath sounds.  Abdominal:     Palpations: Abdomen is soft.     Tenderness: There is no abdominal tenderness.  Musculoskeletal:        General: No swelling.     Cervical back: Neck supple.  Skin:    General: Skin is warm and dry.     Capillary Refill: Capillary refill takes less than 2 seconds.  Neurological:     Mental Status: She is alert.  Psychiatric:        Mood and Affect: Mood normal.      UC Treatments / Results  Labs (all labs ordered are listed, but only abnormal results are displayed) Labs Reviewed  POC COVID19/FLU A&B COMBO    EKG   Radiology No results found.  Procedures Procedures (including critical care time)  Medications Ordered in UC Medications - No data to display  Initial Impression / Assessment and Plan / UC Course  I have reviewed the triage vital signs and the nursing notes.  Pertinent labs & imaging results that were available during my care of the patient were reviewed by me and considered in my medical decision making (see chart for details).     URI with cough.  Patient overall well-appearing in no acute distress.  Lungs clear to auscultation on exam.  Supportive care discussed.  Will send in cough syrup and Tessalon . Final Clinical  Impressions(s) / UC Diagnoses   Final diagnoses:  Viral  URI with cough     Discharge Instructions      Recommend Mucinex and Flonase  for congestion and postnasal drip Take cough medication as needed Drink plenty of fluids Return if you develop new or worsening symptoms.    ED Prescriptions     Medication Sig Dispense Auth. Provider   benzonatate  (TESSALON ) 100 MG capsule Take 1 capsule (100 mg total) by mouth every 8 (eight) hours. 21 capsule Ward, Shantai Tiedeman Z, PA-C   promethazine -dextromethorphan (PROMETHAZINE -DM) 6.25-15 MG/5ML syrup Take 5 mLs by mouth 4 (four) times daily as needed for cough. 118 mL Ward, Aaronmichael Brumbaugh Z, PA-C   fluticasone  (FLONASE ) 50 MCG/ACT nasal spray Place 1 spray into both nostrils daily. 9.9 mL Ward, Harlene PEDLAR, PA-C      PDMP not reviewed this encounter.   Ward, Harlene PEDLAR, PA-C 07/12/23 1147

## 2023-07-12 NOTE — Discharge Instructions (Addendum)
 Recommend Mucinex and Flonase for congestion and postnasal drip Take cough medication as needed Drink plenty of fluids Return if you develop new or worsening symptoms.

## 2023-07-12 NOTE — ED Triage Notes (Signed)
 Pt states cough and congestion for the past 3 days.  States she has been taking dayquil and mucinex at home.

## 2023-07-12 NOTE — ED Notes (Signed)
 Discussed whether her pharmacy choice was open today-new years day-.  Patient refused to have scripts sent to another pharmacy.  Understands she will not be able to get scripts until tomorrow morning .

## 2023-07-13 ENCOUNTER — Other Ambulatory Visit (HOSPITAL_COMMUNITY): Payer: Self-pay

## 2023-07-18 ENCOUNTER — Other Ambulatory Visit (HOSPITAL_COMMUNITY): Payer: Self-pay

## 2023-08-24 ENCOUNTER — Ambulatory Visit: Payer: 59 | Admitting: Family Medicine

## 2023-08-28 ENCOUNTER — Encounter: Payer: Self-pay | Admitting: Family Medicine

## 2023-08-28 ENCOUNTER — Ambulatory Visit: Payer: 59 | Admitting: Family Medicine

## 2023-08-28 VITALS — BP 118/72 | HR 60 | Temp 98.5°F | Ht 62.0 in | Wt 165.2 lb

## 2023-08-28 DIAGNOSIS — I10 Essential (primary) hypertension: Secondary | ICD-10-CM

## 2023-08-28 DIAGNOSIS — R7303 Prediabetes: Secondary | ICD-10-CM | POA: Diagnosis not present

## 2023-08-28 DIAGNOSIS — S90822A Blister (nonthermal), left foot, initial encounter: Secondary | ICD-10-CM | POA: Diagnosis not present

## 2023-08-28 DIAGNOSIS — M79672 Pain in left foot: Secondary | ICD-10-CM

## 2023-08-28 NOTE — Patient Instructions (Signed)
 Marland Kitchen

## 2023-08-28 NOTE — Progress Notes (Signed)
 Established Patient Office Visit   Subjective  Patient ID: [name], female    DOB: 23-Aug-1965  Age: 58 y.o. MRN: [id number]  Chief Complaint  Patient presents with   Blister    Patient complains of a painful blister noted along the lateral aspect of the left foot x2 weeks, tried soaks once    Patient is a 58 year old female seen for acute concern.  Patient endorses blister on lateral left foot after wearing "cheap shoes".  Patient states she soaked her foot in Epsom salt.  Concerned area is still sore.  Pt wearing different shoes to work.    Patient Active Problem List   Diagnosis Date Noted   Prediabetes 08/28/2023   Essential hypertension 04/15/2021   Bunion, right foot 04/15/2021   Flat feet, bilateral 04/15/2021   Primary osteoarthritis of right knee 12/14/2020   Class 1 obesity due to excess calories with body mass index (BMI) of 31.0 to 31.9 in adult 12/14/2020   Fibroadenoma of left breast 12/23/2019   Past Medical History:  Diagnosis Date   Fibroids    Past Surgical History:  Procedure Laterality Date   MYOMECTOMY ABDOMINAL APPROACH     TONSILLECTOMY     Social History   Tobacco Use   Smoking status: Former   Smokeless tobacco: Never  Vaping Use   Vaping status: Never Used  Substance Use Topics   Alcohol use: Yes    Comment: occass.    Drug use: No   Family History  Problem Relation Age of Onset   Diabetes Mother    Hypertension Mother    Diabetes Father    Hypertension Father    Allergies  Allergen Reactions   Penicillins Hives      ROS Negative unless stated above    Objective:     BP 118/72   Pulse 60   Temp 98.5 F (36.9 C) (Oral)   Ht 5\' 2"  (1.575 m)   Wt 165 lb 3.2 oz (74.9 kg)   LMP 06/13/2018   SpO2 97%   BMI 30.22 kg/m  BP Readings from Last 3 Encounters:  08/28/23 118/72  07/12/23 (!) 154/77  06/12/23 (!) 150/90   Wt Readings from Last 3 Encounters:  08/28/23 165 lb 3.2 oz (74.9 kg)  06/12/23 166 lb (75.3 kg)   11/02/22 170 lb 6.4 oz (77.3 kg)     Physical Exam Constitutional:      General: She is not in acute distress.    Appearance: Normal appearance.     Comments: Patient wearing slide on clog shoes.  HENT:     Head: Normocephalic and atraumatic.     Nose: Nose normal.     Mouth/Throat:     Mouth: Mucous membranes are moist.  Cardiovascular:     Rate and Rhythm: Normal rate and regular rhythm.     Heart sounds: Normal heart sounds. No murmur heard.    No gallop.  Pulmonary:     Effort: Pulmonary effort is normal. No respiratory distress.     Breath sounds: Normal breath sounds. No wheezing, rhonchi or rales.  Skin:    General: Skin is warm and dry.          Comments: Left lateral mid foot with mild TTP.  No TTP with weightbearing.  Neurological:     Mental Status: She is alert and oriented to person, place, and time.  Psychiatric:        Behavior: Behavior is cooperative.  No results found for any visits on 08/28/23.    Assessment & Plan:  Left foot pain  Blister of left foot, initial encounter  Essential hypertension  Prediabetes  Patient advised not to wear tight fitting shoes that press/rub on foot.  Advised to consider obtaining wider with shoes or wearing men's shoes.  Okay to continue Epsom salt soaks.  Given precautions.  BP controlled.  Continue hydrochlorothiazide 12.5 mg daily and lifestyle modifications.  Continue lifestyle modifications for prediabetes.  Hemoglobin A1c was 6.1% on 06/12/2023.  Follow-up in May for repeat A1c.  Consider colonoscopy.  No follow-ups on file.   Deeann Saint, MD

## 2023-09-18 ENCOUNTER — Ambulatory Visit: Payer: 59 | Admitting: Family Medicine

## 2023-09-26 ENCOUNTER — Ambulatory Visit (INDEPENDENT_AMBULATORY_CARE_PROVIDER_SITE_OTHER): Admitting: Podiatry

## 2023-09-26 ENCOUNTER — Encounter: Payer: Self-pay | Admitting: Podiatry

## 2023-09-26 DIAGNOSIS — M21611 Bunion of right foot: Secondary | ICD-10-CM | POA: Diagnosis not present

## 2023-09-26 DIAGNOSIS — L84 Corns and callosities: Secondary | ICD-10-CM

## 2023-09-26 NOTE — Progress Notes (Signed)
  Subjective:  Patient ID: [name], female    DOB: [birth year],   MRN: [id number]  Chief Complaint  Patient presents with   Foot Pain    Dorsal/lateral foot left - keeps getting a blister intermittently, doesn't itch, wonders if her shoes are rubbing, sore every time she wears an enclosed shoe   New Patient (Initial Visit)    58 y.o. female presents for concern as above. Relates it likely started after a certain pair of shoes she was wearing. Has been doing better but just wanted it checked out. Also relates a bunion on the right foot and has some questions about this .  Marland Kitchen Denies any other pedal complaints. Denies n/v/f/c.   Past Medical History:  Diagnosis Date   Fibroids     Objective:  Physical Exam: Vascular: DP/PT pulses 2/4 bilateral. CFT <3 seconds. Normal hair growth on digits. No edema.  Skin. No lacerations or abrasions bilateral feet. No blister or skin ulceration noted today.  Musculoskeletal: MMT 5/5 bilateral lower extremities in DF, PF, Inversion and Eversion. Deceased ROM in DF of ankle joint. Right moderate HAV deformity noted no pain to palpation. Lateral fifth styloid process tenderness.  Neurological: Sensation intact to light touch.   Assessment:   1. Callus of foot   2. Bunion, right foot      Plan:  Patient was evaluated and treated and all questions answered. -Discussed HAV  and blisters and skin irritation and treatment options;conservative and surgical management; risks, benefits, alternatives discussed. All patient's questions answered. -Discussed padding and wide shoe gear.   -Recommend continue with good supportive shoes and inserts.  -Discussed surgical options if needed if every painful for the bunion  -Cortisone for any itchiness on the foot.  -Patient to return to office as needed or sooner if condition worsens.   Louann Sjogren, DPM

## 2023-11-09 ENCOUNTER — Other Ambulatory Visit: Payer: Self-pay | Admitting: Obstetrics and Gynecology

## 2023-11-09 DIAGNOSIS — Z1231 Encounter for screening mammogram for malignant neoplasm of breast: Secondary | ICD-10-CM

## 2023-11-15 ENCOUNTER — Ambulatory Visit: Payer: 59 | Admitting: Family Medicine

## 2023-11-29 ENCOUNTER — Ambulatory Visit: Admitting: Family Medicine

## 2023-12-20 ENCOUNTER — Ambulatory Visit: Admitting: Family Medicine

## 2023-12-27 ENCOUNTER — Ambulatory Visit: Admitting: Family Medicine

## 2023-12-27 ENCOUNTER — Encounter: Payer: Self-pay | Admitting: Family Medicine

## 2023-12-27 VITALS — BP 124/72 | HR 65 | Temp 98.0°F | Ht 62.0 in | Wt 166.0 lb

## 2023-12-27 DIAGNOSIS — L858 Other specified epidermal thickening: Secondary | ICD-10-CM

## 2023-12-27 DIAGNOSIS — I10 Essential (primary) hypertension: Secondary | ICD-10-CM | POA: Diagnosis not present

## 2023-12-27 DIAGNOSIS — R7303 Prediabetes: Secondary | ICD-10-CM | POA: Diagnosis not present

## 2023-12-27 LAB — POCT GLYCOSYLATED HEMOGLOBIN (HGB A1C): Hemoglobin A1C: 5.6 % (ref 4.0–5.6)

## 2023-12-27 NOTE — Progress Notes (Signed)
 Established Patient Office Visit   Subjective  Patient ID: [name], female    DOB: 1965/08/14  Age: 58 y.o. MRN: [id number]  Chief Complaint  Patient presents with   Medical Management of Chronic Issues    3 month Diabetic follow-up A1c check     Patient is a 58 year old female seen for follow-up.  Patient with history of prediabetes.  Making changes to diet including limiting energy drink intake.  Now may have a Celsius a few times per week.  Was drinking several Monster energy drinks per day.  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  Patient has difficulty sleeping.  May get 1 hour of sleep during the school year.  Has noticed sleeping in more now the school is out for the summer.  Patient inquires if she can stop taking blood pressure medication now that is controlled.  Patient notes a family history of CVD, CHF, CKD in mother and sister.  Patient mentions having appointment with dermatology for the dark spots on her arms.  They seem worse as she has gotten older.  Has tried scrubbing arms in shower.  Patient's mother and grandmother had similar skin.    Patient Active Problem List   Diagnosis Date Noted   Prediabetes 08/28/2023   Essential hypertension 04/15/2021   Bunion, right foot 04/15/2021   Flat feet, bilateral 04/15/2021   Primary osteoarthritis of right knee 12/14/2020   Class 1 obesity due to excess calories with body mass index (BMI) of 31.0 to 31.9 in adult 12/14/2020   Fibroadenoma of left breast 12/23/2019   Past Medical History:  Diagnosis Date   Fibroids    Past Surgical History:  Procedure Laterality Date   MYOMECTOMY ABDOMINAL APPROACH     TONSILLECTOMY     Social History   Tobacco Use   Smoking status: Former   Smokeless tobacco: Never  Vaping Use   Vaping status: Never Used  Substance Use Topics   Alcohol use: Yes     Comment: occass.    Drug use: No   Family History  Problem Relation Age of Onset   Diabetes Mother    Hypertension Mother    Diabetes Father    Hypertension Father    Allergies  Allergen Reactions   Penicillins Hives    ROS Negative unless stated above    Objective:     BP 124/72 (BP Location: Left Arm, Patient Position: Sitting, Cuff Size: Normal)   Pulse 65   Temp 98 F (36.7 C) (Oral)   Ht 5' 2 (1.575 m)   Wt 166 lb (75.3 kg)   LMP 06/13/2018   SpO2 94%   BMI 30.36 kg/m  BP Readings from Last 3 Encounters:  12/27/23 124/72  08/28/23 118/72  07/12/23 (!) 154/77   Wt Readings from Last 3 Encounters:  12/27/23 166 lb (75.3 kg)  08/28/23 165 lb 3.2 oz (74.9 kg)  06/12/23 166 lb (75.3 kg)      Physical Exam Constitutional:      General: She is not in acute distress.    Appearance: Normal appearance.  HENT:     Head: Normocephalic and atraumatic.     Nose: Nose normal.     Mouth/Throat:     Mouth: Mucous membranes are moist.   Cardiovascular:     Rate  and Rhythm: Normal rate and regular rhythm.     Heart sounds: Normal heart sounds. No murmur heard.    No gallop.  Pulmonary:     Effort: Pulmonary effort is normal. No respiratory distress.     Breath sounds: Normal breath sounds. No wheezing, rhonchi or rales.   Skin:    General: Skin is warm and dry.     Comments: Keratosis Polarus on bilateral upper arms   Neurological:     Mental Status: She is alert and oriented to person, place, and time.        12/27/2023   10:17 AM 11/02/2022   11:04 AM 05/04/2022   11:15 AM  Depression screen PHQ 2/9  Decreased Interest 0 0 0  Down, Depressed, Hopeless 0 0 0  PHQ - 2 Score 0 0 0  Altered sleeping 0 0 0  Tired, decreased energy 0 0 0  Change in appetite 0 0 0  Feeling bad or failure about yourself  0 0 0  Trouble concentrating 0 0 0  Moving slowly or fidgety/restless 0 0 0  Suicidal thoughts 0 0 0  PHQ-9 Score 0 0 0  Difficult doing  work/chores Not difficult at all  Not difficult at all      12/27/2023   10:17 AM 11/02/2022   11:05 AM 12/14/2020   11:51 AM  GAD 7 : Generalized Anxiety Score  Nervous, Anxious, on Edge 0 0 0  Control/stop worrying 0 0 0  Worry too much - different things 0 0 0  Trouble relaxing 0 0 0  Restless 0 0 0  Easily annoyed or irritable 0 0 0  Afraid - awful might happen 0 0 0  Total GAD 7 Score 0 0 0  Anxiety Difficulty Not difficult at all       Results for orders placed or performed in visit on 12/27/23  POC HgB A1c  Result Value Ref Range   Hemoglobin A1C 5.6 4.0 - 5.6 %   HbA1c POC (<> result, manual entry)     HbA1c, POC (prediabetic range)     HbA1c, POC (controlled diabetic range)        Assessment & Plan:   Essential hypertension  Prediabetes -     POCT glycosylated hemoglobin (Hb A1C)  Keratosis pilaris   BP well-controlled.  Patient to continue taking HCTZ 12.5 mg daily.  Continue lifestyle modifications including monitoring sodium intake and increasing physical activity.  Patient encouraged to increase water intake especially if out in the sun.  Patient congratulated on lifestyle modifications.  Hemoglobin A1c was 6.1% 06/12/2023.  A1c this visit 5.6%.  Patient has upcoming appointment with Derm.  Discussed supportive care. Return in about 6 months (around 06/27/2024) for physical.   Viola Greulich, MD

## 2024-01-23 DIAGNOSIS — Z01419 Encounter for gynecological examination (general) (routine) without abnormal findings: Secondary | ICD-10-CM | POA: Diagnosis not present

## 2024-02-05 ENCOUNTER — Ambulatory Visit
Admission: RE | Admit: 2024-02-05 | Discharge: 2024-02-05 | Disposition: A | Discharge status: Home / Self Care | Source: Ambulatory Visit | Attending: Obstetrics and Gynecology | Admitting: Obstetrics and Gynecology

## 2024-02-05 DIAGNOSIS — Z1231 Encounter for screening mammogram for malignant neoplasm of breast: Secondary | ICD-10-CM | POA: Diagnosis not present

## 2024-02-20 ENCOUNTER — Other Ambulatory Visit (HOSPITAL_COMMUNITY): Payer: Self-pay

## 2024-02-20 MED ORDER — CLINDAMYCIN HCL 150 MG PO CAPS
300.0000 mg | ORAL_CAPSULE | Freq: Four times a day (QID) | ORAL | 0 refills | Status: DC
  Filled 2024-02-20: qty 56, 7d supply, fill #0

## 2024-03-25 ENCOUNTER — Other Ambulatory Visit (HOSPITAL_COMMUNITY): Payer: Self-pay

## 2024-05-13 ENCOUNTER — Other Ambulatory Visit (HOSPITAL_COMMUNITY): Payer: Self-pay

## 2024-05-13 MED ORDER — CLINDAMYCIN HCL 300 MG PO CAPS
300.0000 mg | ORAL_CAPSULE | Freq: Three times a day (TID) | ORAL | 0 refills | Status: DC
  Filled 2024-05-13: qty 21, 7d supply, fill #0

## 2024-05-23 ENCOUNTER — Other Ambulatory Visit (HOSPITAL_COMMUNITY): Payer: Self-pay

## 2024-06-23 ENCOUNTER — Other Ambulatory Visit: Payer: Self-pay | Admitting: Family Medicine

## 2024-06-23 DIAGNOSIS — I10 Essential (primary) hypertension: Secondary | ICD-10-CM

## 2024-06-24 ENCOUNTER — Other Ambulatory Visit (HOSPITAL_COMMUNITY): Payer: Self-pay

## 2024-06-24 ENCOUNTER — Other Ambulatory Visit: Payer: Self-pay

## 2024-06-24 MED ORDER — HYDROCHLOROTHIAZIDE 12.5 MG PO CAPS
12.5000 mg | ORAL_CAPSULE | Freq: Every day | ORAL | 3 refills | Status: AC
  Filled 2024-06-24: qty 90, 90d supply, fill #0

## 2024-06-27 ENCOUNTER — Encounter: Payer: Self-pay | Admitting: Family Medicine

## 2024-06-27 ENCOUNTER — Ambulatory Visit (INDEPENDENT_AMBULATORY_CARE_PROVIDER_SITE_OTHER): Admitting: Family Medicine

## 2024-06-27 VITALS — BP 112/82 | HR 67 | Temp 98.5°F | Ht 62.0 in | Wt 161.2 lb

## 2024-06-27 DIAGNOSIS — I10 Essential (primary) hypertension: Secondary | ICD-10-CM | POA: Diagnosis not present

## 2024-06-27 DIAGNOSIS — Z1211 Encounter for screening for malignant neoplasm of colon: Secondary | ICD-10-CM

## 2024-06-27 DIAGNOSIS — Z0000 Encounter for general adult medical examination without abnormal findings: Secondary | ICD-10-CM | POA: Diagnosis not present

## 2024-06-27 LAB — COMPREHENSIVE METABOLIC PANEL WITH GFR
ALT: 27 U/L (ref 3–35)
AST: 26 U/L (ref 5–37)
Albumin: 4.7 g/dL (ref 3.5–5.2)
Alkaline Phosphatase: 90 U/L (ref 39–117)
BUN: 20 mg/dL (ref 6–23)
CO2: 28 meq/L (ref 19–32)
Calcium: 10.1 mg/dL (ref 8.4–10.5)
Chloride: 102 meq/L (ref 96–112)
Creatinine, Ser: 0.59 mg/dL (ref 0.40–1.20)
GFR: 98.39 mL/min (ref >60.00)
Glucose, Bld: 89 mg/dL (ref 70–99)
Potassium: 3.7 meq/L (ref 3.5–5.1)
Sodium: 140 meq/L (ref 135–145)
Total Bilirubin: 0.5 mg/dL (ref 0.2–1.2)
Total Protein: 8.1 g/dL (ref 6.0–8.3)

## 2024-06-27 LAB — CBC WITH DIFFERENTIAL/PLATELET
Basophils Absolute: 0.1 K/uL (ref 0.0–0.1)
Basophils Relative: 1 % (ref 0.0–3.0)
Eosinophils Absolute: 0.2 K/uL (ref 0.0–0.7)
Eosinophils Relative: 2.3 % (ref 0.0–5.0)
HCT: 43.2 % (ref 36.0–46.0)
Hemoglobin: 14.2 g/dL (ref 12.0–15.0)
Lymphocytes Relative: 30 % (ref 12.0–46.0)
Lymphs Abs: 2 K/uL (ref 0.7–4.0)
MCHC: 32.9 g/dL (ref 30.0–36.0)
MCV: 83.8 fl (ref 78.0–100.0)
Monocytes Absolute: 0.4 K/uL (ref 0.1–1.0)
Monocytes Relative: 5.5 % (ref 3.0–12.0)
Neutro Abs: 4.2 K/uL (ref 1.4–7.7)
Neutrophils Relative %: 61.2 % (ref 43.0–77.0)
Platelets: 302 K/uL (ref 150.0–400.0)
RBC: 5.15 Mil/uL — ABNORMAL HIGH (ref 3.87–5.11)
RDW: 14.3 % (ref 11.5–15.5)
WBC: 6.8 K/uL (ref 4.0–10.5)

## 2024-06-27 LAB — LIPID PANEL
Cholesterol: 148 mg/dL (ref 28–200)
HDL: 48.7 mg/dL (ref >39.00)
LDL Cholesterol: 86 mg/dL (ref 10–99)
NonHDL: 99.05
Total CHOL/HDL Ratio: 3
Triglycerides: 65 mg/dL (ref 10.0–149.0)
VLDL: 13 mg/dL (ref 0.0–40.0)

## 2024-06-27 LAB — TSH: TSH: 1.63 u[IU]/mL (ref 0.35–5.50)

## 2024-06-27 LAB — T4, FREE: Free T4: 0.81 ng/dL (ref 0.60–1.60)

## 2024-06-27 LAB — HEMOGLOBIN A1C: Hgb A1c MFr Bld: 6 % (ref 4.6–6.5)

## 2024-06-27 NOTE — Progress Notes (Signed)
 Established Patient Office Visit   Subjective  Patient ID: [name], female    DOB: Jan 16, 1966  Age: 58 y.o. MRN: [id number]  Chief Complaint  Patient presents with   Annual Exam    Patient is a 58 year old female seen for CPE.  Patient states she has been doing well overall and feels good.  Patient is made consistent changes to diet including decreasing intake of sodas, energy drinks, and tea.  Also stopped eating as much fried chicken.  Exercising regularly.  Lost 5 pounds since last OFV.  Patient has yet to purchase a BP cuff to monitor BP at home.  Inquires about stopping medication.  Patient also inquires about medication recalls.  Patient ready to get colonoscopy.  Had Pap and mammogram with OB/GYN 02/05/2024.    Patient Active Problem List   Diagnosis Date Noted   Prediabetes 08/28/2023   Essential hypertension 04/15/2021   Bunion, right foot 04/15/2021   Flat feet, bilateral 04/15/2021   Primary osteoarthritis of right knee 12/14/2020   Class 1 obesity due to excess calories with body mass index (BMI) of 31.0 to 31.9 in adult 12/14/2020   Fibroadenoma of left breast 12/23/2019   Past Medical History:  Diagnosis Date   Fibroids    Past Surgical History:  Procedure Laterality Date   MYOMECTOMY ABDOMINAL APPROACH     TONSILLECTOMY     Social History[1] Family History  Problem Relation Age of Onset   Diabetes Mother    Hypertension Mother    Diabetes Father    Hypertension Father    Allergies[2]  ROS Negative unless stated above    Objective:     BP 112/82 (BP Location: Left Arm, Patient Position: Sitting, Cuff Size: Large)   Pulse 67   Temp 98.5 F (36.9 C) (Oral)   Ht 5' 2 (1.575 m)   Wt 161 lb 3.2 oz (73.1 kg)   LMP 06/13/2018   SpO2 98%   BMI 29.48 kg/m  BP Readings from Last 3 Encounters:  06/27/24 112/82  12/27/23 124/72  08/28/23 118/72   Wt Readings from Last 3 Encounters:  06/27/24 161 lb 3.2 oz (73.1 kg)  12/27/23 166 lb (75.3  kg)  08/28/23 165 lb 3.2 oz (74.9 kg)      Physical Exam Constitutional:      Appearance: Normal appearance.  HENT:     Head: Normocephalic and atraumatic.     Right Ear: Tympanic membrane, ear canal and external ear normal.     Left Ear: Tympanic membrane, ear canal and external ear normal.     Nose: Nose normal.     Mouth/Throat:     Mouth: Mucous membranes are moist.     Pharynx: No oropharyngeal exudate or posterior oropharyngeal erythema.  Eyes:     General: No scleral icterus.    Extraocular Movements: Extraocular movements intact.     Conjunctiva/sclera: Conjunctivae normal.     Pupils: Pupils are equal, round, and reactive to light.  Neck:     Thyroid : No thyromegaly.     Vascular: No carotid bruit.  Cardiovascular:     Rate and Rhythm: Normal rate and regular rhythm.     Pulses: Normal pulses.     Heart sounds: Normal heart sounds. No murmur heard.    No friction rub.  Pulmonary:     Effort: Pulmonary effort is normal.     Breath sounds: Normal breath sounds. No wheezing, rhonchi or rales.  Abdominal:     General: Bowel  sounds are normal.     Palpations: Abdomen is soft.     Tenderness: There is no abdominal tenderness.  Musculoskeletal:        General: No deformity. Normal range of motion.  Lymphadenopathy:     Cervical: No cervical adenopathy.  Skin:    General: Skin is warm and dry.     Findings: No lesion.  Neurological:     General: No focal deficit present.     Mental Status: She is alert and oriented to person, place, and time.  Psychiatric:        Mood and Affect: Mood normal.        Thought Content: Thought content normal.        12/27/2023   10:17 AM 11/02/2022   11:04 AM 05/04/2022   11:15 AM  Depression screen PHQ 2/9  Decreased Interest 0 0 0  Down, Depressed, Hopeless 0 0 0  PHQ - 2 Score 0 0 0  Altered sleeping 0 0 0  Tired, decreased energy 0 0 0  Change in appetite 0 0 0  Feeling bad or failure about yourself  0 0 0  Trouble  concentrating 0 0 0  Moving slowly or fidgety/restless 0 0 0  Suicidal thoughts 0 0 0  PHQ-9 Score 0  0  0   Difficult doing work/chores Not difficult at all  Not difficult at all     Data saved with a previous flowsheet row definition      12/27/2023   10:17 AM 11/02/2022   11:05 AM 12/14/2020   11:51 AM  GAD 7 : Generalized Anxiety Score  Nervous, Anxious, on Edge 0 0 0  Control/stop worrying 0 0 0  Worry too much - different things 0 0 0  Trouble relaxing 0 0 0  Restless 0 0 0  Easily annoyed or irritable 0 0 0  Afraid - awful might happen 0 0 0  Total GAD 7 Score 0 0 0  Anxiety Difficulty Not difficult at all       No results found for any visits on 06/27/24.    Assessment & Plan:   Well adult exam -     CBC with Differential/Platelet; Future -     Comprehensive metabolic panel with GFR; Future -     Hemoglobin A1c; Future -     Lipid panel; Future -     T4, free; Future -     TSH; Future  Essential hypertension -     Comprehensive metabolic panel with GFR; Future  Screen for colon cancer -     Ambulatory referral to Gastroenterology   Age-appropriate health screenings discussed.  Obtain labs.  Immunizations reviewed.  Colonoscopy order placed.  Mammogram and Pap done 02/05/2024 with OB/GYN.  BP well-controlled.  Continue HCTZ 12.5 mg daily.  Patient to obtain BP monitor for home use.  For consistently controlled readings with continued diet and exercise may be able to have a trial off medication.  Refill on HCTZ recently sent to pharmacy with refills.  Return in about 6 months (around 12/26/2024) for blood pressure.  Sooner if needed.  Clotilda JONELLE Single, MD    [1]  Social History Tobacco Use   Smoking status: Former   Smokeless tobacco: Never  Vaping Use   Vaping status: Never Used  Substance Use Topics   Alcohol use: Yes    Comment: occass.    Drug use: No  [2]  Allergies Allergen Reactions   Penicillins Hives

## 2024-06-27 NOTE — Patient Instructions (Addendum)
 Your blood pressure medication was just sent to the pharmacy on 06/24/2024 with refills.  Continue taking the current dose.  Get a blood pressure cuff to monitor your blood pressure at home.  Continue making the changes to diet and exercise as the results are showing.  You have lost 5 pounds since last visit and blood pressure continues to improve.  A referral to the gastroenterology was placed for colonoscopy.  They will contact you about setting up this appointment.

## 2024-06-28 ENCOUNTER — Telehealth: Payer: Self-pay

## 2024-06-28 NOTE — Telephone Encounter (Signed)
 Copied from CRM #[id number]. Topic: Clinical - Lab/Test Results >> Jun 28, 2024  2:04 PM Ashley R wrote: Reason for CRM: requesting callback for lab results/note [phone]

## 2024-07-01 ENCOUNTER — Telehealth: Payer: Self-pay | Admitting: *Deleted

## 2024-07-01 NOTE — Telephone Encounter (Signed)
 Called and spoke with patient she is aware Dr. Mercer is [facility] .

## 2024-07-01 NOTE — Telephone Encounter (Signed)
 Copied from CRM #[id number]. Topic: Clinical - Lab/Test Results >> Jul 01, 2024 11:27 AM Eva FALCON wrote: Reason for CRM: Pt was calling in to check status of lab results from 12/18. Please call and advise.

## 2024-07-22 ENCOUNTER — Telehealth: Payer: Self-pay | Admitting: *Deleted

## 2024-07-22 ENCOUNTER — Ambulatory Visit: Payer: Self-pay | Admitting: Family Medicine

## 2024-07-22 NOTE — Telephone Encounter (Signed)
 Copied from CRM #[id number]. Topic: Clinical - Lab/Test Results >> Jul 22, 2024  1:14 PM Cherylann RAMAN wrote: Reason for CRM: Patient called regarding lab results informed patient that provider is [facility]. Please contact patient [phone] regarding.

## 2024-07-22 NOTE — Telephone Encounter (Signed)
 Patient called in stating that she hasn't heard form anyone in regards to her labs that she had done on 06/27/2024. She also would like to know if the order for her to have a colonoscopy has been sent out?

## 2024-07-30 ENCOUNTER — Other Ambulatory Visit (HOSPITAL_COMMUNITY): Payer: Self-pay

## 2024-07-30 MED ORDER — IBUPROFEN 600 MG PO TABS
600.0000 mg | ORAL_TABLET | Freq: Four times a day (QID) | ORAL | 0 refills | Status: AC | PRN
  Filled 2024-07-30: qty 20, 5d supply, fill #0

## 2024-08-14 ENCOUNTER — Encounter: Payer: Self-pay | Admitting: Gastroenterology

## 2024-12-26 ENCOUNTER — Ambulatory Visit: Admitting: Family Medicine
